# Patient Record
Sex: Female | Born: 1985 | Race: White | Hispanic: No | Marital: Single | Smoking: Current every day smoker
Health system: Southern US, Community
[De-identification: ages and names within clinical notes are randomized; demographics above are authoritative.]

## PROBLEM LIST (undated history)

## (undated) DIAGNOSIS — Z765 Malingerer [conscious simulation]: Secondary | ICD-10-CM

## (undated) DIAGNOSIS — G40109 Localization-related (focal) (partial) symptomatic epilepsy and epileptic syndromes with simple partial seizures, not intractable, without status epilepticus: Secondary | ICD-10-CM

## (undated) DIAGNOSIS — F1910 Other psychoactive substance abuse, uncomplicated: Secondary | ICD-10-CM

## (undated) DIAGNOSIS — N83209 Unspecified ovarian cyst, unspecified side: Secondary | ICD-10-CM

## (undated) DIAGNOSIS — B1920 Unspecified viral hepatitis C without hepatic coma: Secondary | ICD-10-CM

## (undated) DIAGNOSIS — F419 Anxiety disorder, unspecified: Secondary | ICD-10-CM

## (undated) DIAGNOSIS — M26629 Arthralgia of temporomandibular joint, unspecified side: Secondary | ICD-10-CM

## (undated) DIAGNOSIS — Z21 Asymptomatic human immunodeficiency virus [HIV] infection status: Secondary | ICD-10-CM

## (undated) DIAGNOSIS — R569 Unspecified convulsions: Secondary | ICD-10-CM

## (undated) DIAGNOSIS — B20 Human immunodeficiency virus [HIV] disease: Secondary | ICD-10-CM

## (undated) DIAGNOSIS — D696 Thrombocytopenia, unspecified: Secondary | ICD-10-CM

## (undated) DIAGNOSIS — N200 Calculus of kidney: Secondary | ICD-10-CM

## (undated) HISTORY — PX: ABDOMINAL HYSTERECTOMY: SHX81

## (undated) HISTORY — PX: LITHOTRIPSY: SUR834

## (undated) HISTORY — PX: ANKLE SURGERY: SHX546

## (undated) HISTORY — PX: TUBAL LIGATION: SHX77

## (undated) HISTORY — PX: APPENDECTOMY: SHX54

## (undated) HISTORY — PX: FRACTURE SURGERY: SHX138

---

## 2010-04-17 ENCOUNTER — Emergency Department (HOSPITAL_BASED_OUTPATIENT_CLINIC_OR_DEPARTMENT_OTHER): Admission: EM | Admit: 2010-04-17 | Discharge: 2010-04-17 | Payer: Self-pay | Admitting: Emergency Medicine

## 2010-04-25 ENCOUNTER — Emergency Department (HOSPITAL_BASED_OUTPATIENT_CLINIC_OR_DEPARTMENT_OTHER): Admission: EM | Admit: 2010-04-25 | Discharge: 2010-04-26 | Payer: Self-pay | Admitting: Emergency Medicine

## 2010-05-26 ENCOUNTER — Emergency Department (HOSPITAL_BASED_OUTPATIENT_CLINIC_OR_DEPARTMENT_OTHER): Admission: EM | Admit: 2010-05-26 | Discharge: 2010-05-26 | Payer: Self-pay | Admitting: Emergency Medicine

## 2010-06-26 ENCOUNTER — Emergency Department (HOSPITAL_BASED_OUTPATIENT_CLINIC_OR_DEPARTMENT_OTHER): Admission: EM | Admit: 2010-06-26 | Discharge: 2010-06-26 | Payer: Self-pay | Admitting: Emergency Medicine

## 2010-07-23 ENCOUNTER — Ambulatory Visit: Payer: Self-pay | Admitting: Diagnostic Radiology

## 2010-07-23 ENCOUNTER — Emergency Department (HOSPITAL_BASED_OUTPATIENT_CLINIC_OR_DEPARTMENT_OTHER): Admission: EM | Admit: 2010-07-23 | Discharge: 2010-07-23 | Payer: Self-pay | Admitting: Emergency Medicine

## 2010-09-06 ENCOUNTER — Emergency Department (HOSPITAL_BASED_OUTPATIENT_CLINIC_OR_DEPARTMENT_OTHER)
Admission: EM | Admit: 2010-09-06 | Discharge: 2010-09-06 | Payer: Self-pay | Source: Home / Self Care | Admitting: Emergency Medicine

## 2010-09-10 ENCOUNTER — Emergency Department (HOSPITAL_BASED_OUTPATIENT_CLINIC_OR_DEPARTMENT_OTHER)
Admission: EM | Admit: 2010-09-10 | Discharge: 2010-09-10 | Payer: Self-pay | Source: Home / Self Care | Admitting: Emergency Medicine

## 2010-09-14 ENCOUNTER — Emergency Department (HOSPITAL_BASED_OUTPATIENT_CLINIC_OR_DEPARTMENT_OTHER)
Admission: EM | Admit: 2010-09-14 | Discharge: 2010-09-14 | Payer: Self-pay | Source: Home / Self Care | Admitting: Emergency Medicine

## 2010-10-10 ENCOUNTER — Emergency Department (HOSPITAL_BASED_OUTPATIENT_CLINIC_OR_DEPARTMENT_OTHER)
Admission: EM | Admit: 2010-10-10 | Discharge: 2010-10-10 | Payer: Self-pay | Source: Home / Self Care | Admitting: Emergency Medicine

## 2011-03-07 ENCOUNTER — Emergency Department (HOSPITAL_BASED_OUTPATIENT_CLINIC_OR_DEPARTMENT_OTHER)
Admission: EM | Admit: 2011-03-07 | Discharge: 2011-03-07 | Disposition: A | Discharge status: Home / Self Care | Payer: Self-pay | Attending: Emergency Medicine | Admitting: Emergency Medicine

## 2011-03-07 DIAGNOSIS — Z21 Asymptomatic human immunodeficiency virus [HIV] infection status: Secondary | ICD-10-CM | POA: Insufficient documentation

## 2011-03-07 DIAGNOSIS — F17200 Nicotine dependence, unspecified, uncomplicated: Secondary | ICD-10-CM | POA: Insufficient documentation

## 2011-03-07 DIAGNOSIS — K089 Disorder of teeth and supporting structures, unspecified: Secondary | ICD-10-CM | POA: Insufficient documentation

## 2011-03-12 ENCOUNTER — Emergency Department (INDEPENDENT_AMBULATORY_CARE_PROVIDER_SITE_OTHER): Payer: Self-pay

## 2011-03-12 ENCOUNTER — Emergency Department (HOSPITAL_BASED_OUTPATIENT_CLINIC_OR_DEPARTMENT_OTHER)
Admission: EM | Admit: 2011-03-12 | Discharge: 2011-03-12 | Disposition: A | Discharge status: Home / Self Care | Payer: Self-pay | Attending: Emergency Medicine | Admitting: Emergency Medicine

## 2011-03-12 DIAGNOSIS — Z21 Asymptomatic human immunodeficiency virus [HIV] infection status: Secondary | ICD-10-CM | POA: Insufficient documentation

## 2011-03-12 DIAGNOSIS — W19XXXA Unspecified fall, initial encounter: Secondary | ICD-10-CM

## 2011-03-12 DIAGNOSIS — M25549 Pain in joints of unspecified hand: Secondary | ICD-10-CM

## 2011-03-12 DIAGNOSIS — S60229A Contusion of unspecified hand, initial encounter: Secondary | ICD-10-CM | POA: Insufficient documentation

## 2011-07-01 ENCOUNTER — Emergency Department (HOSPITAL_BASED_OUTPATIENT_CLINIC_OR_DEPARTMENT_OTHER)
Admission: EM | Admit: 2011-07-01 | Discharge: 2011-07-01 | Disposition: A | Discharge status: Home / Self Care | Payer: Self-pay | Attending: Emergency Medicine | Admitting: Emergency Medicine

## 2011-07-01 ENCOUNTER — Encounter: Payer: Self-pay | Admitting: *Deleted

## 2011-07-01 DIAGNOSIS — Z21 Asymptomatic human immunodeficiency virus [HIV] infection status: Secondary | ICD-10-CM | POA: Insufficient documentation

## 2011-07-01 DIAGNOSIS — K0889 Other specified disorders of teeth and supporting structures: Secondary | ICD-10-CM

## 2011-07-01 DIAGNOSIS — K089 Disorder of teeth and supporting structures, unspecified: Secondary | ICD-10-CM | POA: Insufficient documentation

## 2011-07-01 HISTORY — DX: Arthralgia of temporomandibular joint, unspecified side: M26.629

## 2011-07-01 HISTORY — DX: Human immunodeficiency virus (HIV) disease: B20

## 2011-07-01 HISTORY — DX: Asymptomatic human immunodeficiency virus (hiv) infection status: Z21

## 2011-07-01 MED ORDER — HYDROCODONE-ACETAMINOPHEN 5-325 MG PO TABS: 2.0000 | ORAL_TABLET | ORAL | Status: AC | PRN

## 2011-07-01 NOTE — ED Notes (Addendum)
Pt c/o pain in her upper left molar x1 week. Pt cannot see a dentist until next Friday. Pt sts she was supposed to get same filled when she was in jail but they released her prior to having it done. Pt sts she finished a 2 week course of amoxicllin yesterday.

## 2011-07-01 NOTE — ED Provider Notes (Addendum)
History     CSN: [id number] Arrival date & time: 07/01/2011  3:11 PM  Chief Complaint  Patient presents with  . Dental Pain    (Consider location/radiation/quality/duration/timing/severity/associated sxs/prior treatment) Patient is a 25 y.o. female presenting with tooth pain. The history is provided by the patient. No language interpreter was used.  Dental PainThe primary symptoms include mouth pain. The symptoms began more than 1 month ago. The symptoms are worsening. The symptoms are new. The symptoms occur constantly.  Additional symptoms include: gum swelling.    Past Medical History  Diagnosis Date  . HIV (human immunodeficiency virus infection)   . TMJ syndrome     Past Surgical History  Procedure Date  . Appendectomy   . Cesarean section   . Ankle surgery     No family history on file.  History  Substance Use Topics  . Smoking status: Passive Smoker  . Smokeless tobacco: Not on file  . Alcohol Use: No    OB History    Grav Para Term Preterm Abortions TAB SAB Ect Mult Living                  Review of Systems  HENT: Positive for dental problem.   All other systems reviewed and are negative.    Allergies  Penicillins; Naproxen; and Tramadol  Home Medications   Current Outpatient Rx  Name Route Sig Dispense Refill  . EMTRICITABINE-TENOFOVIR 200-300 MG PO TABS Oral Take 1 tablet by mouth daily.      Marland Kitchen FOSAMPRENAVIR CALCIUM 700 MG PO TABS Oral Take 1,400 mg by mouth daily.      . IBUPROFEN 200 MG PO TABS Oral Take 800 mg by mouth every 6 (six) hours as needed. For pain      . RITONAVIR 100 MG PO CAPS Oral Take 100 mg by mouth daily.        Pulse 94  Temp(Src) 98.1 F (36.7 C) (Oral)  Resp 17  SpO2 100%  LMP 06/10/2011  Physical Exam  Nursing note and vitals reviewed. Constitutional: She appears well-developed and well-nourished.  HENT:  Head: Normocephalic and atraumatic.       Swollen,  Gum, no obv abscess  Eyes: Conjunctivae and EOM  are normal. Pupils are equal, round, and reactive to light.  Neck: Normal range of motion. Neck supple.  Cardiovascular: Normal rate.   Pulmonary/Chest: Effort normal.  Abdominal: Soft.  Musculoskeletal: Normal range of motion.  Neurological: She is alert.  Skin: Skin is warm.    ED Course  Procedures (including critical care time)  Labs Reviewed - No data to display No results found.   No diagnosis found.    MDM          Langston Masker, PA 07/01/11 1727  Langston Masker, [place] 07/07/11 [phone]

## 2011-07-03 NOTE — ED Provider Notes (Signed)
Medical screening examination/treatment/procedure(s) were performed by non-physician practitioner and as supervising physician I was immediately available for consultation/collaboration.  Danzig Macgregor, MD 07/03/11 1929 

## 2011-07-06 ENCOUNTER — Emergency Department (INDEPENDENT_AMBULATORY_CARE_PROVIDER_SITE_OTHER): Payer: Self-pay

## 2011-07-06 ENCOUNTER — Emergency Department (HOSPITAL_BASED_OUTPATIENT_CLINIC_OR_DEPARTMENT_OTHER)
Admission: EM | Admit: 2011-07-06 | Discharge: 2011-07-06 | Disposition: A | Discharge status: Home / Self Care | Payer: Self-pay | Attending: Emergency Medicine | Admitting: Emergency Medicine

## 2011-07-06 ENCOUNTER — Encounter (HOSPITAL_BASED_OUTPATIENT_CLINIC_OR_DEPARTMENT_OTHER): Payer: Self-pay | Admitting: *Deleted

## 2011-07-06 DIAGNOSIS — T148XXA Other injury of unspecified body region, initial encounter: Secondary | ICD-10-CM

## 2011-07-06 DIAGNOSIS — M549 Dorsalgia, unspecified: Secondary | ICD-10-CM | POA: Insufficient documentation

## 2011-07-06 DIAGNOSIS — M25519 Pain in unspecified shoulder: Secondary | ICD-10-CM

## 2011-07-06 DIAGNOSIS — M542 Cervicalgia: Secondary | ICD-10-CM

## 2011-07-06 DIAGNOSIS — X503XXA Overexertion from repetitive movements, initial encounter: Secondary | ICD-10-CM | POA: Insufficient documentation

## 2011-07-06 LAB — URINALYSIS, ROUTINE W REFLEX MICROSCOPIC
Bilirubin Urine: NEGATIVE
Glucose, UA: NEGATIVE mg/dL
Hgb urine dipstick: NEGATIVE
Ketones, ur: NEGATIVE mg/dL
Leukocytes, UA: NEGATIVE
Nitrite: NEGATIVE
Protein, ur: NEGATIVE mg/dL
Specific Gravity, Urine: 1.01 (ref 1.005–1.030)
Urobilinogen, UA: 0.2 mg/dL (ref 0.0–1.0)
pH: 6.5 (ref 5.0–8.0)

## 2011-07-06 LAB — PREGNANCY, URINE: Preg Test, Ur: NEGATIVE

## 2011-07-06 MED ORDER — CYCLOBENZAPRINE HCL 5 MG PO TABS: 5.0000 mg | ORAL_TABLET | Freq: Two times a day (BID) | ORAL | Status: AC | PRN

## 2011-07-06 NOTE — ED Notes (Signed)
Pt c/o right shoulder pain and back pain x 1 day after moving furniture, seen here 5 days ago for dental pain and is out of vicodin

## 2011-07-06 NOTE — ED Notes (Signed)
Pt ambulated to radiology with a steady gait.

## 2011-07-06 NOTE — ED Provider Notes (Signed)
History     CSN: [id number] Arrival date & time: 07/06/2011  7:31 PM   First MD Initiated Contact with Patient 07/06/11 1931      Chief Complaint  Patient presents with  . Back Pain  . Shoulder Pain    (Consider location/radiation/quality/duration/timing/severity/associated sxs/prior treatment) HPI Comments: Pt states that she was doing some heavy lifting and then the her neck and back started to hurt and she has pain going down to her right shoulder  Patient is a 25 y.o. female presenting with neck injury. The history is provided by the patient. No language interpreter was used.  Neck Injury This is a new problem. The current episode started today. The problem occurs constantly. The problem has been unchanged. Pertinent negatives include no fever, numbness or weakness. Exacerbated by: movement. She has tried acetaminophen for the symptoms. The treatment provided no relief.    Past Medical History  Diagnosis Date  . HIV (human immunodeficiency virus infection)   . TMJ syndrome     Past Surgical History  Procedure Date  . Appendectomy   . Cesarean section   . Ankle surgery     History reviewed. No pertinent family history.  History  Substance Use Topics  . Smoking status: Passive Smoker  . Smokeless tobacco: Not on file  . Alcohol Use: No    OB History    Grav Para Term Preterm Abortions TAB SAB Ect Mult Living                  Review of Systems  Constitutional: Negative for fever.  Neurological: Negative for weakness and numbness.  All other systems reviewed and are negative.    Allergies  Penicillins; Naproxen; and Tramadol  Home Medications   Current Outpatient Rx  Name Route Sig Dispense Refill  . EMTRICITABINE-TENOFOVIR 200-300 MG PO TABS Oral Take 1 tablet by mouth daily.      Marland Kitchen FOSAMPRENAVIR CALCIUM 700 MG PO TABS Oral Take 1,400 mg by mouth daily.      Marland Kitchen HYDROCODONE-ACETAMINOPHEN 5-325 MG PO TABS Oral Take 2 tablets by mouth every 4 (four)  hours as needed for pain. 20 tablet 0  . IBUPROFEN 200 MG PO TABS Oral Take 800 mg by mouth every 6 (six) hours as needed. For pain      . RITONAVIR 100 MG PO CAPS Oral Take 100 mg by mouth daily.        BP 118/77  Pulse 109  Temp(Src) 98.3 F (36.8 C) (Oral)  Resp 16  Ht 5\' 5"  (1.651 m)  Wt 150 lb (68.04 kg)  BMI 24.96 kg/m2  SpO2 100%  LMP 06/10/2011  Physical Exam  Nursing note and vitals reviewed. Constitutional: She is oriented to person, place, and time. She appears well-developed and well-nourished.  HENT:  Head: Normocephalic and atraumatic.  Neck: Neck supple.  Cardiovascular: Normal rate and regular rhythm.   Pulmonary/Chest: Effort normal and breath sounds normal.  Abdominal: Soft. Bowel sounds are normal.  Musculoskeletal: Normal range of motion.       Cervical back: She exhibits tenderness and bony tenderness.       Lumbar back: She exhibits tenderness.  Neurological: She is alert and oriented to person, place, and time.  Skin: Skin is warm and dry.    ED Course  Procedures (including critical care time)   Labs Reviewed  URINALYSIS, ROUTINE W REFLEX MICROSCOPIC  PREGNANCY, URINE   No results found.   1. Muscle strain   2. Back pain  MDM  Pt not having any neuro deficits likely related to strain:pt is okay to follow up        Teressa Lower, NP 07/06/11 2041

## 2011-07-08 NOTE — ED Provider Notes (Signed)
Evaluation and management procedures were performed by the PA/NP under my supervision/collaboration.    Khristian Phillippi D Romesha Scherer, MD 07/08/11 1925 

## 2011-07-12 NOTE — ED Provider Notes (Signed)
Medical screening examination/treatment/procedure(s) were performed by non-physician practitioner and as supervising physician I was immediately available for consultation/collaboration.  Dorin Stooksbury, MD 07/12/11 1401 

## 2011-07-21 ENCOUNTER — Emergency Department (HOSPITAL_BASED_OUTPATIENT_CLINIC_OR_DEPARTMENT_OTHER)
Admission: EM | Admit: 2011-07-21 | Discharge: 2011-07-22 | Disposition: A | Discharge status: Home / Self Care | Payer: Self-pay | Attending: Emergency Medicine | Admitting: Emergency Medicine

## 2011-07-21 ENCOUNTER — Encounter (HOSPITAL_BASED_OUTPATIENT_CLINIC_OR_DEPARTMENT_OTHER): Payer: Self-pay | Admitting: *Deleted

## 2011-07-21 DIAGNOSIS — K089 Disorder of teeth and supporting structures, unspecified: Secondary | ICD-10-CM | POA: Insufficient documentation

## 2011-07-21 DIAGNOSIS — K0889 Other specified disorders of teeth and supporting structures: Secondary | ICD-10-CM

## 2011-07-21 DIAGNOSIS — Z21 Asymptomatic human immunodeficiency virus [HIV] infection status: Secondary | ICD-10-CM | POA: Insufficient documentation

## 2011-07-21 DIAGNOSIS — K029 Dental caries, unspecified: Secondary | ICD-10-CM | POA: Insufficient documentation

## 2011-07-21 MED ORDER — HYDROCODONE-ACETAMINOPHEN 5-325 MG PO TABS: ORAL_TABLET | ORAL | Status: DC

## 2011-07-21 MED ORDER — CLINDAMYCIN HCL 150 MG PO CAPS: ORAL_CAPSULE | ORAL | Status: DC

## 2011-07-21 NOTE — ED Notes (Signed)
Pt c/o toothache x 1 day. 

## 2011-07-21 NOTE — ED Provider Notes (Signed)
History     CSN: [id number] Arrival date & time: 07/21/2011  9:44 PM   First MD Initiated Contact with Patient 07/21/11 2343      Chief Complaint  Patient presents with  . Dental Pain    (Consider location/radiation/quality/duration/timing/severity/associated sxs/prior treatment) HPI This is a 25 year old white female with about a four-month history of a toothache located in her left upper first molar. The pain worsened last month she was seen here on October 17. She states she cannot afford to see a dentist and returns with acutely worsening pain. It is exacerbated by eating, not relieved by Goody powders, and radiates to the left side of her face. There is no associated cervical lymphadenopathy but she does complain of a bad taste in her mouth. She is being treated for HIV infection.  Past Medical History  Diagnosis Date  . HIV (human immunodeficiency virus infection)   . TMJ syndrome     Past Surgical History  Procedure Date  . Appendectomy   . Cesarean section   . Ankle surgery     History reviewed. No pertinent family history.  History  Substance Use Topics  . Smoking status: Passive Smoker  . Smokeless tobacco: Not on file  . Alcohol Use: No    OB History    Grav Para Term Preterm Abortions TAB SAB Ect Mult Living                  Review of Systems  All other systems reviewed and are negative.    Allergies  Penicillins; Naproxen; and Tramadol  Home Medications   Current Outpatient Rx  Name Route Sig Dispense Refill  . EMTRICITABINE-TENOFOVIR 200-300 MG PO TABS Oral Take 1 tablet by mouth daily.      Marland Kitchen FOSAMPRENAVIR CALCIUM 700 MG PO TABS Oral Take 1,400 mg by mouth daily.      . IBUPROFEN 200 MG PO TABS Oral Take 800 mg by mouth every 6 (six) hours as needed. For pain      . RITONAVIR 100 MG PO CAPS Oral Take 100 mg by mouth daily.        BP 115/70  Pulse 73  Temp(Src) 98.4 F (36.9 C) (Oral)  Resp 16  Ht 5\' 5"  (1.651 m)  Wt 150 lb (68.04  kg)  BMI 24.96 kg/m2  SpO2 100%  LMP 06/20/2011  Physical Exam General: Well-developed, well-nourished female in no acute distress; appearance consistent with age of record HENT: normocephalic, atraumatic; carious left upper first molar with amalgam filling, very tender to percussion Eyes: Normal appearance Neck: supple; no lymphadenopathy Heart: regular rate and rhythm Lungs: Normal respiratory effort and excursion Abdomen: soft; nondistended Extremities: No deformity; full range of motion Neurologic: motor function intact in all extremities and symmetric; no facial droop Skin: Warm and dry     ED Course  Procedures (including critical care time)    MDM  Referred to [facility] at the [facility] this November 16 and 17th the        Hanley Seamen, MD 07/21/11 2357

## 2011-07-22 MED ORDER — AMOXICILLIN 500 MG PO CAPS: 1000.0000 mg | ORAL_CAPSULE | Freq: Two times a day (BID) | ORAL | Status: AC

## 2011-08-09 ENCOUNTER — Encounter (HOSPITAL_BASED_OUTPATIENT_CLINIC_OR_DEPARTMENT_OTHER): Payer: Self-pay | Admitting: *Deleted

## 2011-08-09 ENCOUNTER — Emergency Department (INDEPENDENT_AMBULATORY_CARE_PROVIDER_SITE_OTHER): Payer: Self-pay

## 2011-08-09 ENCOUNTER — Emergency Department (HOSPITAL_BASED_OUTPATIENT_CLINIC_OR_DEPARTMENT_OTHER)
Admission: EM | Admit: 2011-08-09 | Discharge: 2011-08-09 | Disposition: A | Discharge status: Home / Self Care | Payer: Self-pay | Attending: Emergency Medicine | Admitting: Emergency Medicine

## 2011-08-09 DIAGNOSIS — W010XXA Fall on same level from slipping, tripping and stumbling without subsequent striking against object, initial encounter: Secondary | ICD-10-CM | POA: Insufficient documentation

## 2011-08-09 DIAGNOSIS — Y92009 Unspecified place in unspecified non-institutional (private) residence as the place of occurrence of the external cause: Secondary | ICD-10-CM | POA: Insufficient documentation

## 2011-08-09 DIAGNOSIS — M25539 Pain in unspecified wrist: Secondary | ICD-10-CM

## 2011-08-09 DIAGNOSIS — S60211A Contusion of right wrist, initial encounter: Secondary | ICD-10-CM

## 2011-08-09 DIAGNOSIS — Z21 Asymptomatic human immunodeficiency virus [HIV] infection status: Secondary | ICD-10-CM | POA: Insufficient documentation

## 2011-08-09 DIAGNOSIS — S60219A Contusion of unspecified wrist, initial encounter: Secondary | ICD-10-CM | POA: Insufficient documentation

## 2011-08-09 DIAGNOSIS — W19XXXA Unspecified fall, initial encounter: Secondary | ICD-10-CM

## 2011-08-09 DIAGNOSIS — Z79899 Other long term (current) drug therapy: Secondary | ICD-10-CM | POA: Insufficient documentation

## 2011-08-09 MED ORDER — ACETAMINOPHEN 160 MG PO TABS: 320.0000 mg | ORAL_TABLET | Freq: Four times a day (QID) | ORAL | Status: AC | PRN

## 2011-08-09 MED ORDER — OXYCODONE-ACETAMINOPHEN 5-325 MG PO TABS
1.0000 | ORAL_TABLET | Freq: Once | ORAL | Status: AC
  Administered 2011-08-09: 1 via ORAL
  Filled 2011-08-09: qty 1

## 2011-08-09 MED ORDER — ONDANSETRON HCL 8 MG PO TABS
8.0000 mg | ORAL_TABLET | Freq: Once | ORAL | Status: AC
  Administered 2011-08-09: 8 mg via ORAL
  Filled 2011-08-09: qty 1

## 2011-08-09 NOTE — ED Notes (Signed)
Dr. Campos at bedside   

## 2011-08-09 NOTE — ED Notes (Signed)
Pt c/o right arm pain x 1 day

## 2011-08-09 NOTE — ED Provider Notes (Signed)
History     CSN: [id number] Arrival date & time: 08/09/2011  5:01 PM   First MD Initiated Contact with Patient 08/09/11 1653      Chief Complaint  Patient presents with  . Arm Injury    (Consider location/radiation/quality/duration/timing/severity/associated sxs/prior treatment) Patient is a 25 y.o. female presenting with arm injury. The history is provided by the patient.  Arm Injury    patient reports slipping on her floor in her house this afternoon landing on the right side of her body and injured her right wrist.  She is unsure if she extended her right arm and fell on an outstretched hand or if she just landed on it.  He reports pain with range of motion of her right wrist.  Her pain is worsened by palpation and movement.  This improved by nothing.  She reports the pain is severe this time on the way here she became nauseated.  She denies abdominal pain chest pain shortness of breath.  Past Medical History  Diagnosis Date  . HIV (human immunodeficiency virus infection)   . TMJ syndrome     Past Surgical History  Procedure Date  . Appendectomy   . Cesarean section   . Ankle surgery     History reviewed. No pertinent family history.  History  Substance Use Topics  . Smoking status: Passive Smoker  . Smokeless tobacco: Not on file  . Alcohol Use: No    OB History    Grav Para Term Preterm Abortions TAB SAB Ect Mult Living                  Review of Systems  All other systems reviewed and are negative.    Allergies  Penicillins; Naproxen; and Tramadol  Home Medications   Current Outpatient Rx  Name Route Sig Dispense Refill  . EMTRICITABINE-TENOFOVIR 200-300 MG PO TABS Oral Take 1 tablet by mouth daily.      Marland Kitchen FOSAMPRENAVIR CALCIUM 700 MG PO TABS Oral Take 1,400 mg by mouth daily.      . IBUPROFEN 200 MG PO TABS Oral Take 800 mg by mouth every 6 (six) hours as needed. For pain      . RITONAVIR 100 MG PO CAPS Oral Take 100 mg by mouth daily.      Marland Kitchen  HYDROCODONE-ACETAMINOPHEN 5-325 MG PO TABS  1-2 tablets every 6 hours as needed for pain. 20 tablet 0    BP 99/66  Pulse 92  Temp(Src) 98.1 F (36.7 C) (Oral)  Resp 16  Ht 5\' 5"  (1.651 m)  Wt 150 lb (68.04 kg)  BMI 24.96 kg/m2  SpO2 99%  LMP 07/21/2011  Physical Exam  Constitutional: She is oriented to person, place, and time. She appears well-developed and well-nourished.  HENT:  Head: Normocephalic.  Eyes: EOM are normal.  Neck: Normal range of motion.  Pulmonary/Chest: Effort normal.  Abdominal: She exhibits no distension. There is no tenderness.  Musculoskeletal: Normal range of motion.       Mild tenderness of the distal radius and ulnar.  There is no snuff box tenderness.  Her right hand is neurovascularly intact.  His normal right radial pulse.  There is no significant swelling or bruising noted.  Neurological: She is alert and oriented to person, place, and time.  Psychiatric: She has a normal mood and affect.    ED Course  Procedures (including critical care time)  Labs Reviewed - No data to display Dg Wrist Complete Right  08/09/2011  *RADIOLOGY  REPORT*  Clinical Data: Larey Seat and injured right wrist.  RIGHT WRIST - COMPLETE 3+ VIEW 08/09/2011:  Comparison: No prior wrist imaging.  Right hand x-rays 03/12/2011.  Findings: No evidence of acute or subacute fracture or dislocation. Well-preserved joint spaces.  Well-preserved bone mineral density. No intrinsic osseous abnormalities.  IMPRESSION: Normal examination.  Original Report Authenticated By: Arnell Sieving, M.D.   I personally reviewed the x-ray  1. Contusion of right wrist       MDM  Suspect contusion/sprain of right wrist.  X-ray pending.  Pain and nausea treated in the ER.  Abdominal exam is benign        Lyanne Co, MD 08/09/11 1740

## 2011-09-09 ENCOUNTER — Encounter (HOSPITAL_BASED_OUTPATIENT_CLINIC_OR_DEPARTMENT_OTHER): Payer: Self-pay

## 2011-09-09 ENCOUNTER — Emergency Department (HOSPITAL_BASED_OUTPATIENT_CLINIC_OR_DEPARTMENT_OTHER)
Admission: EM | Admit: 2011-09-09 | Discharge: 2011-09-09 | Disposition: A | Discharge status: Home / Self Care | Payer: Self-pay | Attending: Emergency Medicine | Admitting: Emergency Medicine

## 2011-09-09 DIAGNOSIS — Z21 Asymptomatic human immunodeficiency virus [HIV] infection status: Secondary | ICD-10-CM | POA: Insufficient documentation

## 2011-09-09 DIAGNOSIS — K0889 Other specified disorders of teeth and supporting structures: Secondary | ICD-10-CM

## 2011-09-09 DIAGNOSIS — K089 Disorder of teeth and supporting structures, unspecified: Secondary | ICD-10-CM | POA: Insufficient documentation

## 2011-09-09 MED ORDER — HYDROCODONE-ACETAMINOPHEN 5-500 MG PO TABS: 1.0000 | ORAL_TABLET | ORAL | Status: AC | PRN

## 2011-09-09 MED ORDER — IBUPROFEN 600 MG PO TABS: 600.0000 mg | ORAL_TABLET | Freq: Four times a day (QID) | ORAL | Status: AC | PRN

## 2011-09-09 MED ORDER — IBUPROFEN 400 MG PO TABS: 600.0000 mg | ORAL_TABLET | Freq: Once | ORAL | Status: DC

## 2011-09-09 MED ORDER — HYDROCODONE-ACETAMINOPHEN 5-325 MG PO TABS: 1.0000 | ORAL_TABLET | Freq: Once | ORAL | Status: DC

## 2011-09-09 MED ORDER — AMOXICILLIN-POT CLAVULANATE 875-125 MG PO TABS: 1.0000 | ORAL_TABLET | Freq: Two times a day (BID) | ORAL | Status: AC

## 2011-09-09 NOTE — ED Provider Notes (Signed)
History     CSN: [id number]  Arrival date & time 09/09/11  1352   First MD Initiated Contact with Patient 09/09/11 1428      Chief Complaint  Patient presents with  . Dental Pain    (Consider location/radiation/quality/duration/timing/severity/associated sxs/prior treatment) HPI  H/o HIV pw dental pain. Patient with a history of poor dentition. She complains of approximately one week of multiple teeth aching. Patient complaining of right per molar, left lower molar, left upper molar dental pain x one week. 8/10. The patient states the pain has been constant and worsening throughout the week. She denies fever, chills. She's been taking ibuprofen at home without relief. She states she's been compliant with her HAART medications. She denies headache, dizziness, change in vision.    Past Medical History  Diagnosis Date  . HIV (human immunodeficiency virus infection)   . TMJ syndrome     Past Surgical History  Procedure Date  . Appendectomy   . Cesarean section   . Ankle surgery     No family history on file.  History  Substance Use Topics  . Smoking status: Passive Smoker  . Smokeless tobacco: Not on file  . Alcohol Use: No    OB History    Grav Para Term Preterm Abortions TAB SAB Ect Mult Living                  Review of Systems  All other systems reviewed and are negative.   except as noted HPI   Allergies  Penicillins; Naproxen; and Tramadol  Home Medications   Current Outpatient Rx  Name Route Sig Dispense Refill  . AMOXICILLIN-POT CLAVULANATE 875-125 MG PO TABS Oral Take 1 tablet by mouth 2 (two) times daily. 20 tablet 0  . EMTRICITABINE-TENOFOVIR 200-300 MG PO TABS Oral Take 1 tablet by mouth daily.      Marland Kitchen FOSAMPRENAVIR CALCIUM 700 MG PO TABS Oral Take 1,400 mg by mouth daily.      Marland Kitchen HYDROCODONE-ACETAMINOPHEN 5-325 MG PO TABS  1-2 tablets every 6 hours as needed for pain. 20 tablet 0  . HYDROCODONE-ACETAMINOPHEN 5-500 MG PO TABS Oral Take 1 tablet  by mouth every 4 (four) hours as needed for pain. 15 tablet 0  . IBUPROFEN 200 MG PO TABS Oral Take 800 mg by mouth every 6 (six) hours as needed. For pain      . IBUPROFEN 600 MG PO TABS Oral Take 1 tablet (600 mg total) by mouth every 6 (six) hours as needed for pain. 30 tablet 0  . RITONAVIR 100 MG PO CAPS Oral Take 100 mg by mouth daily.        BP 106/73  Pulse 85  Temp(Src) 97.9 F (36.6 C) (Oral)  Resp 16  Ht 5\' 5"  (1.651 m)  Wt 150 lb (68.04 kg)  BMI 24.96 kg/m2  SpO2 100%  LMP 09/02/2011  Physical Exam  Nursing note and vitals reviewed. Constitutional: She is oriented to person, place, and time. She appears well-developed.  HENT:  Head: Atraumatic.  Mouth/Throat: Oropharynx is clear and moist.         Poor dentition, multiple tooth filling, multiple teeth pulled in past  Eyes: Conjunctivae and EOM are normal. Pupils are equal, round, and reactive to light.  Neck: Normal range of motion. Neck supple.  Cardiovascular: Normal rate, regular rhythm, normal heart sounds and intact distal pulses.   Pulmonary/Chest: Effort normal and breath sounds normal. No respiratory distress. She has no wheezes. She has no  rales.  Abdominal: Soft. She exhibits no distension. There is no tenderness. There is no rebound and no guarding.  Musculoskeletal: Normal range of motion.  Neurological: She is alert and oriented to person, place, and time.  Skin: Skin is warm and dry. No rash noted.  Psychiatric: She has a normal mood and affect.    ED Course  Procedures (including critical care time)  Labs Reviewed - No data to display No results found.   1. Pain, dental     MDM  Low suspicion of dental abscess given multiple teeth in different locations. We'll give her ibuprofen and Vicodin here. The same for pain control at home. Advised to make a dental followup. I did give her a prescription for Augmentin for worsening pain, fever given her history of HIV. She states that she has an  allergy to penicillin which includes a rash but she has tolerated amoxicillin in the past.        Forbes Cellar, MD 09/09/11 1521

## 2011-09-09 NOTE — ED Notes (Signed)
Pt reports dental pain x 1 week.  °

## 2011-09-16 DIAGNOSIS — K089 Disorder of teeth and supporting structures, unspecified: Secondary | ICD-10-CM | POA: Insufficient documentation

## 2011-09-16 DIAGNOSIS — Z21 Asymptomatic human immunodeficiency virus [HIV] infection status: Secondary | ICD-10-CM | POA: Insufficient documentation

## 2011-09-16 DIAGNOSIS — Z79899 Other long term (current) drug therapy: Secondary | ICD-10-CM | POA: Insufficient documentation

## 2011-09-16 NOTE — ED Notes (Signed)
Pt c/o tooth pain. Pt states she is to follow up with dentist 1/18.

## 2011-09-17 ENCOUNTER — Encounter (HOSPITAL_BASED_OUTPATIENT_CLINIC_OR_DEPARTMENT_OTHER): Payer: Self-pay | Admitting: Emergency Medicine

## 2011-09-17 ENCOUNTER — Emergency Department (HOSPITAL_BASED_OUTPATIENT_CLINIC_OR_DEPARTMENT_OTHER)
Admission: EM | Admit: 2011-09-17 | Discharge: 2011-09-17 | Disposition: A | Discharge status: Home / Self Care | Payer: Self-pay | Attending: Emergency Medicine | Admitting: Emergency Medicine

## 2011-09-17 DIAGNOSIS — K0889 Other specified disorders of teeth and supporting structures: Secondary | ICD-10-CM

## 2011-09-17 MED ORDER — HYDROCODONE-ACETAMINOPHEN 5-325 MG PO TABS
1.0000 | ORAL_TABLET | Freq: Once | ORAL | Status: AC
  Administered 2011-09-17: 1 via ORAL
  Filled 2011-09-17: qty 1

## 2011-09-17 MED ORDER — HYDROCODONE-ACETAMINOPHEN 5-500 MG PO TABS: 1.0000 | ORAL_TABLET | ORAL | Status: AC | PRN

## 2011-09-17 NOTE — ED Provider Notes (Signed)
History     CSN: [id number]  Arrival date & time 09/16/11  2319   First MD Initiated Contact with Patient 09/17/11 0127      Chief Complaint  Patient presents with  . Dental Pain    (Consider location/radiation/quality/duration/timing/severity/associated sxs/prior treatment) HPI  25yoF h/o HIV pw dental pain. Pt seen by me on 12/21 for same. She states that she ran out of Vicodin 3 days ago and then her pain returned. She's been unable to see dentistry. She continues to complain of pain in her left upper molar, left lower molar, right upper molar. The pain is worse on the left side she describes as 6/10 at this time with radiation to Lt face. She denies fever, chills. She's been taking previously prescribed prescription for amoxicillin and not the Augmentin that was prescribed last time she was seen.    Past Medical History  Diagnosis Date  . HIV (human immunodeficiency virus infection)   . TMJ syndrome     Past Surgical History  Procedure Date  . Appendectomy   . Cesarean section   . Ankle surgery     No family history on file.  History  Substance Use Topics  . Smoking status: Passive Smoker  . Smokeless tobacco: Not on file  . Alcohol Use: No    OB History    Grav Para Term Preterm Abortions TAB SAB Ect Mult Living                  Review of Systems  All other systems reviewed and are negative.   except as noted HPI   Allergies  Penicillins; Naproxen; and Tramadol  Home Medications   Current Outpatient Rx  Name Route Sig Dispense Refill  . AMOXICILLIN-POT CLAVULANATE 875-125 MG PO TABS Oral Take 1 tablet by mouth 2 (two) times daily. 20 tablet 0  . EMTRICITABINE-TENOFOVIR 200-300 MG PO TABS Oral Take 1 tablet by mouth daily.      Marland Kitchen FOSAMPRENAVIR CALCIUM 700 MG PO TABS Oral Take 1,400 mg by mouth daily.      Marland Kitchen HYDROCODONE-ACETAMINOPHEN 5-325 MG PO TABS  1-2 tablets every 6 hours as needed for pain. 20 tablet 0  . HYDROCODONE-ACETAMINOPHEN 5-500 MG  PO TABS Oral Take 1 tablet by mouth every 4 (four) hours as needed for pain. 15 tablet 0  . HYDROCODONE-ACETAMINOPHEN 5-500 MG PO TABS Oral Take 1 tablet by mouth every 4 (four) hours as needed for pain. 20 tablet 0  . IBUPROFEN 200 MG PO TABS Oral Take 800 mg by mouth every 6 (six) hours as needed. For pain      . IBUPROFEN 600 MG PO TABS Oral Take 1 tablet (600 mg total) by mouth every 6 (six) hours as needed for pain. 30 tablet 0  . RITONAVIR 100 MG PO CAPS Oral Take 100 mg by mouth daily.        BP 113/75  Pulse 81  Temp(Src) 97.6 F (36.4 C) (Oral)  Resp 18  SpO2 100%  LMP 09/02/2011  Physical Exam  Nursing note and vitals reviewed. Constitutional: She is oriented to person, place, and time. She appears well-developed.  HENT:  Head: Atraumatic.  Mouth/Throat: Oropharynx is clear and moist.         B/l TM clear  Eyes: Conjunctivae and EOM are normal. Pupils are equal, round, and reactive to light.  Neck: Normal range of motion. Neck supple.  Cardiovascular: Normal rate, regular rhythm, normal heart sounds and intact distal pulses.  Pulmonary/Chest: Effort normal and breath sounds normal. No respiratory distress. She has no wheezes. She has no rales.  Abdominal: Soft. She exhibits no distension. There is no tenderness. There is no rebound and no guarding.  Musculoskeletal: Normal range of motion.  Neurological: She is alert and oriented to person, place, and time.  Skin: Skin is warm and dry. No rash noted.  Psychiatric: She has a normal mood and affect.    ED Course  Procedures (including critical care time)  Labs Reviewed - No data to display No results found.   1. Pain, dental     MDM  Pain control, augmentin (previously prescribed). Needs dental f/u. No drainable abscess today.        Forbes Cellar, MD 09/17/11 0201

## 2011-10-08 ENCOUNTER — Encounter (HOSPITAL_BASED_OUTPATIENT_CLINIC_OR_DEPARTMENT_OTHER): Payer: Self-pay | Admitting: *Deleted

## 2011-10-08 ENCOUNTER — Emergency Department (HOSPITAL_BASED_OUTPATIENT_CLINIC_OR_DEPARTMENT_OTHER)
Admission: EM | Admit: 2011-10-08 | Discharge: 2011-10-08 | Disposition: A | Discharge status: Home / Self Care | Payer: Medicaid Other | Attending: Emergency Medicine | Admitting: Emergency Medicine

## 2011-10-08 DIAGNOSIS — K029 Dental caries, unspecified: Secondary | ICD-10-CM | POA: Insufficient documentation

## 2011-10-08 DIAGNOSIS — Z21 Asymptomatic human immunodeficiency virus [HIV] infection status: Secondary | ICD-10-CM | POA: Insufficient documentation

## 2011-10-08 DIAGNOSIS — K089 Disorder of teeth and supporting structures, unspecified: Secondary | ICD-10-CM | POA: Insufficient documentation

## 2011-10-08 DIAGNOSIS — K0889 Other specified disorders of teeth and supporting structures: Secondary | ICD-10-CM

## 2011-10-08 MED ORDER — HYDROCODONE-ACETAMINOPHEN 5-325 MG PO TABS: 1.0000 | ORAL_TABLET | Freq: Four times a day (QID) | ORAL | Status: AC | PRN

## 2011-10-08 NOTE — ED Provider Notes (Signed)
History    Scribed for Hurman Horn, MD, the patient was seen in room [facility]. This chart was scribed by Katha Cabal.   CSN: [id number]  Arrival date & time 10/08/11  1816   First MD Initiated Contact with Patient 10/08/11 2030      Chief Complaint  Patient presents with  . Dental Pain    (Consider location/radiation/quality/duration/timing/severity/associated sxs/prior treatment) HPI Dawn Velasquez is a 26 y.o. female who presents to the Emergency Department complaining of moderate  to severe persistent left maxillary molar region pain.  Patient reports intermittent pain since June.  Symptoms are not associated with fever, abdominal pain, chest pain, cough, headache or neck stiffness. Patient taking ibuprofen  for pain.  Patient last took Augmentin in December.  Patient did not take the Clindamycin.    Patient has not followed up with her dentist.    Dentist Dr. Shelly Rubenstein   Past Medical History  Diagnosis Date  . HIV (human immunodeficiency virus infection)   . TMJ syndrome     Past Surgical History  Procedure Date  . Appendectomy   . Cesarean section   . Ankle surgery     History reviewed. No pertinent family history.  History  Substance Use Topics  . Smoking status: Passive Smoker  . Smokeless tobacco: Not on file  . Alcohol Use: No    OB History    Grav Para Term Preterm Abortions TAB SAB Ect Mult Living                  Review of Systems  Constitutional: Negative for fever.       10 Systems reviewed and are negative for acute change except as noted in the HPI.  HENT: Positive for dental problem. Negative for congestion and neck stiffness.   Eyes: Negative for discharge and redness.  Respiratory: Negative for cough and shortness of breath.   Cardiovascular: Negative for chest pain.  Gastrointestinal: Negative for vomiting and abdominal pain.  Musculoskeletal: Negative for back pain.  Skin: Negative for rash.  Neurological: Negative for  syncope, numbness and headaches.  Psychiatric/Behavioral:       No behavior change.    Allergies  Penicillins; Naproxen; and Tramadol  Home Medications   Current Outpatient Rx  Name Route Sig Dispense Refill  . EMTRICITABINE-TENOFOVIR 200-300 MG PO TABS Oral Take 1 tablet by mouth daily.      Marland Kitchen FOSAMPRENAVIR CALCIUM 700 MG PO TABS Oral Take 1,400 mg by mouth daily.      . IBUPROFEN 200 MG PO TABS Oral Take 800 mg by mouth every 6 (six) hours as needed. For pain      . RITONAVIR 100 MG PO CAPS Oral Take 100 mg by mouth daily.      Marland Kitchen HYDROCODONE-ACETAMINOPHEN 5-325 MG PO TABS Oral Take 1 tablet by mouth every 6 (six) hours as needed for pain. 10 tablet 0    BP 115/79  Pulse 80  Temp(Src) 98 F (36.7 C) (Oral)  Resp 18  Ht 5\' 5"  (1.651 m)  Wt 160 lb (72.576 kg)  BMI 26.63 kg/m2  SpO2 99%  LMP 09/24/2011  Physical Exam  Nursing note and vitals reviewed. Constitutional:       Awake, alert, nontoxic appearance.  HENT:  Head: Atraumatic.       Multiple dental caries, tender in left maxillary molar region, no gingival swelling or pus drainage   Eyes: Right eye exhibits no discharge. Left eye exhibits no discharge.  Neck: Neck  supple.  Pulmonary/Chest: Effort normal. She exhibits no tenderness.  Abdominal: Soft. There is no tenderness. There is no rebound.  Musculoskeletal: She exhibits no tenderness.       Baseline ROM, no obvious new focal weakness.  Neurological:       Mental status and motor strength appears baseline for patient and situation.  Skin: No rash noted.  Psychiatric: She has a normal mood and affect.    ED Course  Procedures (including critical care time)   DIAGNOSTIC STUDIES: Oxygen Saturation is 99% on room air, normal by my interpretation.     COORDINATION OF CARE: 8:40 PM  Physical exam complete.   8:43 PM  Plan to discharge patient.  Patient agrees with plan.      LABS / RADIOLOGY:   Labs Reviewed - No data to display No results  found.   1. Pain, dental   2. Dental caries       MDM  I doubt any other EMC precluding discharge at this time including, but not necessarily limited to the following:airway compromise.    I personally performed the services described in this documentation, which was scribed in my presence. The recorded information has been reviewed and considered.   Hurman Horn, MD 10/09/11 [phone]

## 2011-10-08 NOTE — ED Notes (Signed)
Pt states she has had dental pain since June, but" just got her Medicaid yesterday and since Monday is a holiday, cannot see her dentist until Tuesday"

## 2011-10-27 ENCOUNTER — Encounter (HOSPITAL_BASED_OUTPATIENT_CLINIC_OR_DEPARTMENT_OTHER): Payer: Self-pay | Admitting: *Deleted

## 2011-10-27 ENCOUNTER — Emergency Department (HOSPITAL_BASED_OUTPATIENT_CLINIC_OR_DEPARTMENT_OTHER)
Admission: EM | Admit: 2011-10-27 | Discharge: 2011-10-27 | Disposition: A | Discharge status: Home / Self Care | Payer: Medicaid Other | Attending: Emergency Medicine | Admitting: Emergency Medicine

## 2011-10-27 DIAGNOSIS — K089 Disorder of teeth and supporting structures, unspecified: Secondary | ICD-10-CM | POA: Insufficient documentation

## 2011-10-27 DIAGNOSIS — Z21 Asymptomatic human immunodeficiency virus [HIV] infection status: Secondary | ICD-10-CM | POA: Insufficient documentation

## 2011-10-27 DIAGNOSIS — R51 Headache: Secondary | ICD-10-CM | POA: Insufficient documentation

## 2011-10-27 DIAGNOSIS — K0889 Other specified disorders of teeth and supporting structures: Secondary | ICD-10-CM

## 2011-10-27 MED ORDER — OXYCODONE-ACETAMINOPHEN 5-325 MG PO TABS: 1.0000 | ORAL_TABLET | ORAL | Status: DC | PRN

## 2011-10-27 MED ORDER — LIDOCAINE-EPINEPHRINE 2 %-1:100000 IJ SOLN
INTRAMUSCULAR | Status: AC
  Administered 2011-10-27: 1 mL
  Filled 2011-10-27: qty 1

## 2011-10-27 NOTE — ED Provider Notes (Signed)
History     CSN: [id number]  Arrival date & time 10/27/11  0003   None     Chief Complaint  Patient presents with  . Dental Pain    (Consider location/radiation/quality/duration/timing/severity/associated sxs/prior treatment) HPI Comments: 26 year old female with a history of HIV and right upper tooth extraction that occurred approximately 10 hours ago. She presents with pain and blood clot in the place where the tooth was extracted. It was a right upper premolar. Symptoms are constant, mild to moderate, not associated with fever, swelling, vomit  Patient is a 26 y.o. female presenting with tooth pain. The history is provided by the patient and medical records.  Dental PainThe primary symptoms include headaches. Primary symptoms do not include fever or sore throat.    Past Medical History  Diagnosis Date  . HIV (human immunodeficiency virus infection)   . TMJ syndrome     Past Surgical History  Procedure Date  . Appendectomy   . Cesarean section   . Ankle surgery     History reviewed. No pertinent family history.  History  Substance Use Topics  . Smoking status: Passive Smoker  . Smokeless tobacco: Not on file  . Alcohol Use: No    OB History    Grav Para Term Preterm Abortions TAB SAB Ect Mult Living                  Review of Systems  Constitutional: Negative for fever and chills.  HENT: Negative for sore throat and neck pain.   Gastrointestinal: Negative for nausea and vomiting.  Neurological: Positive for headaches.  Hematological: Negative for adenopathy.    Allergies  Penicillins; Naproxen; and Tramadol  Home Medications   Current Outpatient Rx  Name Route Sig Dispense Refill  . EMTRICITABINE-TENOFOVIR 200-300 MG PO TABS Oral Take 1 tablet by mouth daily.      Marland Kitchen FOSAMPRENAVIR CALCIUM 700 MG PO TABS Oral Take 1,400 mg by mouth daily.      . IBUPROFEN 200 MG PO TABS Oral Take 800 mg by mouth every 6 (six) hours as needed. For pain      .  OXYCODONE-ACETAMINOPHEN 5-325 MG PO TABS Oral Take 1 tablet by mouth every 4 (four) hours as needed for pain. May take 2 tablets PO q 6 hours for severe pain - Do not take with Tylenol as this tablet already contains tylenol 12 tablet 0  . RITONAVIR 100 MG PO CAPS Oral Take 100 mg by mouth daily.        BP 128/90  Pulse 72  Temp(Src) 97.8 F (36.6 C) (Oral)  Resp 17  Ht 5\' 5"  (1.651 m)  Wt 160 lb (72.576 kg)  BMI 26.63 kg/m2  SpO2 100%  LMP 10/26/2011  Physical Exam  Nursing note and vitals reviewed. Constitutional: She appears well-developed and well-nourished. No distress.  HENT:  Head: Normocephalic and atraumatic.  Mouth/Throat: Oropharynx is clear and moist. No oropharyngeal exudate.       Dental Disease - right upper premolar absent with blood clot in the socket, no surrounding abscess, fluctuance erythema or swelling.  Eyes: Conjunctivae are normal. No scleral icterus.  Neck: Normal range of motion. Neck supple. No thyromegaly present.  Cardiovascular: Normal rate and regular rhythm.   Pulmonary/Chest: Effort normal and breath sounds normal.  Lymphadenopathy:    She has no cervical adenopathy.  Neurological: She is alert.  Skin: Skin is warm and dry. No rash noted. She is not diaphoretic.    ED Course  Dental Performed by: Vida Roller Authorized by: Eber Hong D Consent: Verbal consent obtained. Risks and benefits: risks, benefits and alternatives were discussed Consent given by: patient Patient understanding: patient states understanding of the procedure being performed Patient consent: the patient's understanding of the procedure matches consent given Procedure consent: procedure consent matches procedure scheduled Relevant documents: relevant documents present and verified Test results: test results available and properly labeled Site marked: the operative site was marked Imaging studies: imaging studies available Patient identity confirmed: verbally  with patient and arm band Time out: Immediately prior to procedure a "time out" was called to verify the correct patient, procedure, equipment, support staff and site/side marked as required. Preparation: Patient was prepped and draped in the usual sterile fashion. Local anesthesia used: yes Anesthesia: nerve block Local anesthetic: lidocaine 1% with epinephrine Anesthetic total: 2 ml Patient sedated: no Patient tolerance: Patient tolerated the procedure well with no immediate complications. Comments: Maxillary nerve block   (including critical care time)  Labs Reviewed - No data to display No results found.   1. Pain, dental       MDM  Blood clot in the socket of first molar, upper right jaw. I have removed the blood clot after local anesthesia, has packed with gauze, patient biting down at this time with good improvement. Will send home with pain medication and instructions for followup.      Discharge prescription  #1 Percocet, 12 tablets  Vida Roller, MD 10/27/11 [phone]

## 2011-10-27 NOTE — ED Notes (Signed)
C/o right upper mouth pain after having tooth extracted this afternoon

## 2011-10-29 ENCOUNTER — Emergency Department (HOSPITAL_BASED_OUTPATIENT_CLINIC_OR_DEPARTMENT_OTHER)
Admission: EM | Admit: 2011-10-29 | Discharge: 2011-10-29 | Disposition: A | Discharge status: Home / Self Care | Payer: Medicaid Other | Attending: Emergency Medicine | Admitting: Emergency Medicine

## 2011-10-29 ENCOUNTER — Encounter (HOSPITAL_BASED_OUTPATIENT_CLINIC_OR_DEPARTMENT_OTHER): Payer: Self-pay | Admitting: *Deleted

## 2011-10-29 DIAGNOSIS — Z79899 Other long term (current) drug therapy: Secondary | ICD-10-CM | POA: Insufficient documentation

## 2011-10-29 DIAGNOSIS — G8918 Other acute postprocedural pain: Secondary | ICD-10-CM

## 2011-10-29 DIAGNOSIS — K089 Disorder of teeth and supporting structures, unspecified: Secondary | ICD-10-CM | POA: Insufficient documentation

## 2011-10-29 DIAGNOSIS — Z21 Asymptomatic human immunodeficiency virus [HIV] infection status: Secondary | ICD-10-CM | POA: Insufficient documentation

## 2011-10-29 MED ORDER — HYDROCODONE-ACETAMINOPHEN 5-325 MG PO TABS: 2.0000 | ORAL_TABLET | ORAL | Status: AC | PRN

## 2011-10-29 MED ORDER — CLINDAMYCIN HCL 300 MG PO CAPS: 300.0000 mg | ORAL_CAPSULE | Freq: Four times a day (QID) | ORAL | Status: AC

## 2011-10-29 NOTE — ED Provider Notes (Signed)
History     CSN: [id number]  Arrival date & time 10/29/11  8657   First MD Initiated Contact with Patient 10/29/11 2156      Chief Complaint  Patient presents with  . Dental Pain    (Consider location/radiation/quality/duration/timing/severity/associated sxs/prior treatment) Patient is a 26 y.o. female presenting with tooth pain. The history is provided by the patient. No language interpreter was used.  Dental PainThe primary symptoms include mouth pain. The symptoms began more than 1 week ago. The symptoms are worsening. The symptoms are recurrent. The symptoms occur constantly.  Additional symptoms include: gum swelling and gum tenderness. Medical issues include: immunosuppression. Medical issues do not include: alcohol problem.  Pt reports she has taken percocet prescribed by Dr. Hyacinth Meeker.  Pt complains of severe pain at extraction site.  -  Past Medical History  Diagnosis Date  . HIV (human immunodeficiency virus infection)   . TMJ syndrome     Past Surgical History  Procedure Date  . Appendectomy   . Cesarean section   . Ankle surgery     History reviewed. No pertinent family history.  History  Substance Use Topics  . Smoking status: Passive Smoker  . Smokeless tobacco: Not on file  . Alcohol Use: No    OB History    Grav Para Term Preterm Abortions TAB SAB Ect Mult Living                  Review of Systems  HENT: Positive for dental problem.   All other systems reviewed and are negative.    Allergies  Penicillins; Naproxen; and Tramadol  Home Medications   Current Outpatient Rx  Name Route Sig Dispense Refill  . EMTRICITABINE-TENOFOVIR 200-300 MG PO TABS Oral Take 1 tablet by mouth daily.      Marland Kitchen FOSAMPRENAVIR CALCIUM 700 MG PO TABS Oral Take 1,400 mg by mouth 2 (two) times daily.     . IBUPROFEN 200 MG PO TABS Oral Take 800 mg by mouth 3 (three) times daily as needed. For pain     . RITONAVIR 100 MG PO CAPS Oral Take 100 mg by mouth 2 (two) times  daily.       BP 121/73  Pulse 72  Temp(Src) 98.1 F (36.7 C) (Oral)  Resp 18  Ht 5\' 5"  (1.651 m)  Wt 160 lb (72.576 kg)  BMI 26.63 kg/m2  SpO2 100%  LMP 10/26/2011  Physical Exam  Nursing note and vitals reviewed. Constitutional: She appears well-developed and well-nourished.  HENT:  Head: Normocephalic and atraumatic.  Right Ear: External ear normal.  Left Ear: External ear normal.  Mouth/Throat: Oropharynx is clear and moist.       Extraction site looks good,  No sign of abscess  Eyes: Pupils are equal, round, and reactive to light.  Neck: Normal range of motion.  Cardiovascular: Normal rate.   Pulmonary/Chest: Effort normal.  Musculoskeletal: Normal range of motion.  Neurological: She is alert.  Skin: Skin is warm.    ED Course  Procedures (including critical care time)  Labs Reviewed - No data to display No results found.   No diagnosis found.    MDM  Pt has had multiple ED visits for dental pain.  Pt has cause for pain.  I will give pain medication for weekend.  I advised pt she needs to see dentist on Monday.  I counseled pt that I will treat acute pain however she needs to discuss narcotic use with her MD.  Pt  understands that if she requires on going pain management this needs to be done by her MD. (ED can not continue to write her RX's for pain medication)  Pt has had 8 pain related visits in 4 months       Langston Masker, [place] 10/29/11 2213

## 2011-10-29 NOTE — ED Notes (Signed)
Pt states she was here Thursday and "had a clot where her tooth was pulled". Now her face is swollen and she has increased pain. Appt Wed. "Dentist told me I had an abscess." Also c/o N/V Unable to eat.

## 2011-10-30 NOTE — ED Provider Notes (Signed)
Medical screening examination/treatment/procedure(s) were performed by non-physician practitioner and as supervising physician I was immediately available for consultation/collaboration.    Nelia Shi, MD 10/30/11 [phone]

## 2011-12-06 ENCOUNTER — Encounter (HOSPITAL_BASED_OUTPATIENT_CLINIC_OR_DEPARTMENT_OTHER): Payer: Self-pay | Admitting: Emergency Medicine

## 2011-12-06 ENCOUNTER — Emergency Department (HOSPITAL_BASED_OUTPATIENT_CLINIC_OR_DEPARTMENT_OTHER)
Admission: EM | Admit: 2011-12-06 | Discharge: 2011-12-06 | Disposition: A | Discharge status: Home / Self Care | Payer: Medicaid Other | Attending: Emergency Medicine | Admitting: Emergency Medicine

## 2011-12-06 DIAGNOSIS — Z880 Allergy status to penicillin: Secondary | ICD-10-CM | POA: Insufficient documentation

## 2011-12-06 DIAGNOSIS — K068 Other specified disorders of gingiva and edentulous alveolar ridge: Secondary | ICD-10-CM

## 2011-12-06 DIAGNOSIS — M2669 Other specified disorders of temporomandibular joint: Secondary | ICD-10-CM | POA: Insufficient documentation

## 2011-12-06 DIAGNOSIS — Z9089 Acquired absence of other organs: Secondary | ICD-10-CM | POA: Insufficient documentation

## 2011-12-06 DIAGNOSIS — Z21 Asymptomatic human immunodeficiency virus [HIV] infection status: Secondary | ICD-10-CM | POA: Insufficient documentation

## 2011-12-06 DIAGNOSIS — K089 Disorder of teeth and supporting structures, unspecified: Secondary | ICD-10-CM | POA: Insufficient documentation

## 2011-12-06 MED ORDER — HYDROCODONE-ACETAMINOPHEN 5-325 MG PO TABS: 2.0000 | ORAL_TABLET | ORAL | Status: AC | PRN

## 2011-12-06 MED ORDER — CHLORHEXIDINE GLUCONATE 0.12 % MT SOLN: 15.0000 mL | Freq: Two times a day (BID) | OROMUCOSAL | Status: DC

## 2011-12-06 MED ORDER — CHLORHEXIDINE GLUCONATE 0.12 % MT SOLN: 15.0000 mL | Freq: Two times a day (BID) | OROMUCOSAL | Status: AC

## 2011-12-06 NOTE — ED Notes (Signed)
Pt reports persistent pain to RT upper gum x 1 s/p extraction

## 2011-12-06 NOTE — ED Notes (Signed)
Pt stats she had teeth pulled in January and had bone come from her gums last week. C.o pain to her right upper molar.

## 2011-12-06 NOTE — Discharge Instructions (Signed)
Gum Disease Gum disease is an infection of the tissues that surround and support the teeth (periodontium). This includes the gums, connective tissue fibers (ligaments), and the thickened ridges of the tooth bone (sockets). The disease is caused by germs (bacteria) that grow in soft deposits (plaque) on the teeth. This results in redness, soreness, and swelling (inflammation). This inflammation causes the gums to bleed. If left untreated, it can lead to damage of the tissues and supportive bone. Although bacteria are known as the major cause of gum disease, other risk factors include tobacco use, diabetes, certain medications, hormones, pregnancy, and genetic factors. SYMPTOMS   Gums that bleed easily.   Red or swollen gums.   Bad breath that does not go away.   Gums that have pulled away from the teeth.   Loose or separating permanent teeth.   Painful chewing.   Changes in the way your teeth fit together.  DIAGNOSIS  A thorough exam will be performed by a dentist to determine the presence and stage of gum disease. The stage is how far the gum disease has developed. TREATMENT  Treatment is based on the stages of gum disease. The stages include:  Mild. If it is caught early, conditions can improve by brushing and flossing properly.   Moderate. You may need special cleaning (scaling and root planing). This method removes plaque and hardened plaque (tartar) above and below the gum line. Medication may also be used to treat moderate gum disease.   Severe. This stage requires surgery of the gums and supporting bone.  PREVENTION  You can prevent gum disease by:  Practicing good oral hygiene, including brushing and flossing properly.   Avoiding use of tobacco products.   Scheduling regular dental check-ups and cleanings.   Eating a well-balanced diet.  SEEK IMMEDIATE DENTAL OR MEDICAL CARE IF:  You have fever over 102 F (38.9 C).   You have swelling of your face, neck, or jaw.    You are unable to open your mouth.   You have severe pain not controlled by pain medicine.  Document Released: 02/23/2010 Document Revised: 08/25/2011 Document Reviewed: 02/23/2010 [facility] Patient Information 2012 [place], [place].

## 2011-12-06 NOTE — ED Provider Notes (Signed)
History     CSN: [id number]  Arrival date & time 12/06/11  1734   First MD Initiated Contact with Patient 12/06/11 1927      Chief Complaint  Patient presents with  . Dental Pain    (Consider location/radiation/quality/duration/timing/severity/associated sxs/prior treatment) Patient is a 26 y.o. female presenting with tooth pain. The history is provided by the patient. No language interpreter was used.  Dental PainThe primary symptoms include mouth pain. The symptoms began more than 1 week ago. The symptoms are worsening. The symptoms are recurrent. The symptoms occur constantly.  Medical issues do not include: alcohol problem.  Pt here with gum pain.  Pt has had multi visits for gum pain and tooth pain.     Past Medical History  Diagnosis Date  . HIV (human immunodeficiency virus infection)   . TMJ syndrome     Past Surgical History  Procedure Date  . Appendectomy   . Cesarean section   . Ankle surgery     No family history on file.  History  Substance Use Topics  . Smoking status: Passive Smoker  . Smokeless tobacco: Not on file  . Alcohol Use: No    OB History    Grav Para Term Preterm Abortions TAB SAB Ect Mult Living                  Review of Systems  All other systems reviewed and are negative.    Allergies  Penicillins; Naproxen; and Tramadol  Home Medications   Current Outpatient Rx  Name Route Sig Dispense Refill  . EMTRICITABINE-TENOFOVIR 200-300 MG PO TABS Oral Take 1 tablet by mouth daily.     Marland Kitchen FOSAMPRENAVIR CALCIUM 700 MG PO TABS Oral Take 1,400 mg by mouth 2 (two) times daily.     . IBUPROFEN 200 MG PO TABS Oral Take 600-800 mg by mouth 3 (three) times daily as needed. For pain     . RITONAVIR 100 MG PO CAPS Oral Take 100 mg by mouth 2 (two) times daily.     . CHLORHEXIDINE GLUCONATE 0.12 % MT SOLN Mouth/Throat Use as directed 15 mLs in the mouth or throat 2 (two) times daily. 120 mL 0  . HYDROCODONE-ACETAMINOPHEN 5-325 MG PO TABS Oral  Take 2 tablets by mouth every 4 (four) hours as needed for pain. 10 tablet 0    BP 115/76  Pulse 90  Temp(Src) 98.2 F (36.8 C) (Oral)  Resp 16  SpO2 97%  LMP 11/29/2011  Physical Exam  Vitals reviewed. Constitutional: She is oriented to person, place, and time. She appears well-developed and well-nourished.  HENT:  Head: Normocephalic and atraumatic.  Right Ear: External ear normal.  Left Ear: External ear normal.  Nose: Nose normal.  Mouth/Throat: Oropharynx is clear and moist.  Eyes: Conjunctivae are normal. Pupils are equal, round, and reactive to light.  Neck: Normal range of motion. Neck supple.  Cardiovascular: Normal rate.   Pulmonary/Chest: Effort normal.  Abdominal: Soft.  Musculoskeletal: Normal range of motion.  Neurological: She is alert and oriented to person, place, and time.  Skin: Skin is warm.  Psychiatric: She has a normal mood and affect.    ED Course  Procedures (including critical care time)  Labs Reviewed - No data to display No results found.   1. Pain in gums       MDM  Pt given rx for peridex and 10 hydrocodone      Lonia Skinner [place], [place] 12/06/11 2002

## 2011-12-07 NOTE — ED Provider Notes (Signed)
Medical screening examination/treatment/procedure(s) were performed by non-physician practitioner and as supervising physician I was immediately available for consultation/collaboration.   Forbes Cellar, MD 12/07/11 0021

## 2011-12-24 ENCOUNTER — Emergency Department (HOSPITAL_BASED_OUTPATIENT_CLINIC_OR_DEPARTMENT_OTHER)
Admission: EM | Admit: 2011-12-24 | Discharge: 2011-12-24 | Disposition: A | Discharge status: Home / Self Care | Payer: Medicaid Other | Attending: Emergency Medicine | Admitting: Emergency Medicine

## 2011-12-24 ENCOUNTER — Encounter (HOSPITAL_BASED_OUTPATIENT_CLINIC_OR_DEPARTMENT_OTHER): Payer: Self-pay | Admitting: *Deleted

## 2011-12-24 ENCOUNTER — Emergency Department (INDEPENDENT_AMBULATORY_CARE_PROVIDER_SITE_OTHER): Payer: Medicaid Other

## 2011-12-24 DIAGNOSIS — S60229A Contusion of unspecified hand, initial encounter: Secondary | ICD-10-CM | POA: Insufficient documentation

## 2011-12-24 DIAGNOSIS — M79609 Pain in unspecified limb: Secondary | ICD-10-CM | POA: Insufficient documentation

## 2011-12-24 DIAGNOSIS — Z21 Asymptomatic human immunodeficiency virus [HIV] infection status: Secondary | ICD-10-CM | POA: Insufficient documentation

## 2011-12-24 DIAGNOSIS — Z79899 Other long term (current) drug therapy: Secondary | ICD-10-CM | POA: Insufficient documentation

## 2011-12-24 DIAGNOSIS — IMO0002 Reserved for concepts with insufficient information to code with codable children: Secondary | ICD-10-CM | POA: Insufficient documentation

## 2011-12-24 DIAGNOSIS — S6990XA Unspecified injury of unspecified wrist, hand and finger(s), initial encounter: Secondary | ICD-10-CM

## 2011-12-24 DIAGNOSIS — X58XXXA Exposure to other specified factors, initial encounter: Secondary | ICD-10-CM

## 2011-12-24 DIAGNOSIS — M7989 Other specified soft tissue disorders: Secondary | ICD-10-CM | POA: Insufficient documentation

## 2011-12-24 NOTE — Discharge Instructions (Signed)
Contusion  A contusion is a deep bruise. Contusions happen when an injury causes bleeding under the skin. Signs of bruising include pain, puffiness (swelling), and discolored skin. The contusion may turn blue, purple, or yellow.  HOME CARE    Put ice on the injured area.   Put ice in a plastic bag.   Place a towel between your skin and the bag.   Leave the ice on for 15 to 20 minutes, 3 to 4 times a day.   Only take medicine as told by your doctor.   Rest the injured area.   If possible, raise (elevate) the injured area to lessen puffiness.  GET HELP RIGHT AWAY IF:    You have more bruising or puffiness.   You have pain that is getting worse.   Your puffiness or pain is not helped by medicine.  MAKE SURE YOU:    Understand these instructions.   Will watch your condition.   Will get help right away if you are not doing well or get worse.  Document Released: 02/22/2008 Document Revised: 08/25/2011 Document Reviewed: 07/11/2011  [employer] Patient Information 2012 [employer].
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
## 2011-12-24 NOTE — ED Notes (Signed)
D/c home with family to drive- ice pack given for home use- no new rx given

## 2012-01-19 ENCOUNTER — Emergency Department (HOSPITAL_BASED_OUTPATIENT_CLINIC_OR_DEPARTMENT_OTHER)
Admission: EM | Admit: 2012-01-19 | Discharge: 2012-01-19 | Disposition: A | Discharge status: Home / Self Care | Payer: Medicaid Other | Attending: Emergency Medicine | Admitting: Emergency Medicine

## 2012-01-19 ENCOUNTER — Encounter (HOSPITAL_BASED_OUTPATIENT_CLINIC_OR_DEPARTMENT_OTHER): Payer: Self-pay | Admitting: *Deleted

## 2012-01-19 DIAGNOSIS — K089 Disorder of teeth and supporting structures, unspecified: Secondary | ICD-10-CM | POA: Insufficient documentation

## 2012-01-19 DIAGNOSIS — M26609 Unspecified temporomandibular joint disorder, unspecified side: Secondary | ICD-10-CM | POA: Insufficient documentation

## 2012-01-19 DIAGNOSIS — Z21 Asymptomatic human immunodeficiency virus [HIV] infection status: Secondary | ICD-10-CM | POA: Insufficient documentation

## 2012-01-19 DIAGNOSIS — Z9089 Acquired absence of other organs: Secondary | ICD-10-CM | POA: Insufficient documentation

## 2012-01-19 DIAGNOSIS — K0889 Other specified disorders of teeth and supporting structures: Secondary | ICD-10-CM

## 2012-01-19 MED ORDER — HYDROCODONE-ACETAMINOPHEN 5-325 MG PO TABS: 2.0000 | ORAL_TABLET | Freq: Once | ORAL | Status: DC

## 2012-01-19 MED ORDER — HYDROCODONE-ACETAMINOPHEN 5-325 MG PO TABS: 2.0000 | ORAL_TABLET | ORAL | Status: AC | PRN

## 2012-01-19 NOTE — ED Notes (Signed)
Pt states that she was seen by her dentist today and had a procedure states that she was doing fine until she ate dinner and she began to have pain states that it feels like her face is on fire. Pt with left lower jaw pain

## 2012-01-19 NOTE — ED Notes (Signed)
Pt was ordered vicodin x 2 tab here, but pt is driving self.

## 2012-01-19 NOTE — Discharge Instructions (Signed)
Dental Extraction   A dental extraction procedure refers to a routine tooth extraction performed by your dentist. The procedure depends on where and how the tooth is positioned. The procedure can be very quick, sometimes lasting only seconds. Reasons for dental extraction include:   Tooth decay.   Infections (abcesses).   The need to make room for other teeth.   Gum disease s where the supporting bone has been destroyed.   Fractures of the tooth leaving it unrestorable.   Extra teeth (supernumerary) or grossly malformed teeth.   Baby teeth that have not fallen out in time and have not permitted the the permanent teeth to erupt properly.   In preparation for braces where there is not enough room to align the teeth properly.   Not enough room for wisdom teeth (particularly those that are impacted).   Prior to receiving radiation to the head and neck, teeth in the field of radiation may need to be extracted.   LET YOUR CAREGIVER KNOW ABOUT:   Any allergies.   All medicines you are taking:   Including herbs, eye drops, over-the-counter medications, and creams.   Blood thinners (anticoagulants), aspirin or other drugs that may affect blood clotting.   Use of steroids (through mouth or as creams).   Previous problems with anesthetics, including local anesthetics.   History of bleeding or blood problems.   Previous surgery.   Possibility of pregnancy if this applies.   Smoking history.   Any health problems.   RISKS AND COMPLICATIONS   As with any procedure, complications may occur, but they can usually be managed by your caregiver. General surgical complications may include:   Reaction to anesthesia.   Damage to surrounding teeth, nerves, tissues, or structures.   Infection.   Bleeding.   With appropriate treatment and care after surgery, the following complications are very uncommon:   Dry socket (blood clot does not form or stay in place over empty socket). This can delay healing.   Incomplete extraction of roots.    Jawbone injury, pain, or weakness.   BEFORE THE PROCEDURE   Your dental care provider will:   Take a medical and dental history.   Take an X-ray to evaluate the circumstances and how to best extract the tooth.   Do an oral exam.   Depending on the situation, antibiotics may be given before or after the extraction, or before and after.   Your caregivers may review the procedure, the local anaesthesia and/or sedation being used, and what to expect after the procedure with you.   If needed, your dentist may give you a form of sedation, either by medicine you swallow, gas, or intravenously (IV). This will help to relieve anxiety. Complicated extractions may require the use of general anaesthesia.   It is important to follow your caregiver's instructions prior to your procedure to avoid complications. Steps before your procedure may include:   Alert your caregiver if you feel ill (sore throat, fever, upset stomach, etc.) in the days leading up to your procedure.   Stop taking certain medications for several days prior to your procedure such as blood thinners.   Take certain medications, such as antibiotics.   Avoid eating and drinking for several hours before the procedure. This will help you to avoid complications from the sedation or anaesthesia.   Sign a patient consent form.   Have a friend or family member drive you to the dentist and drive you home after the procedure.     Wear comfortable, loose clothing. Limit makeup and jewelry.   Quit smoking. If you are a smoker, this will raise the chances of a healing problem after your procedure. If you are thinking about quitting, talk to your surgeon about how long before the operation you should stop smoking. You may also get help from your primary caregiver.   PROCEDURE   Dental extraction is typically done as an outpatient procedure. IV sedation, local anesthesia, or both may be used. It will keep you comfortable and free of pain during the procedure.   There are 2  types of extractions:   Simple extraction involves a tooth that is visible in the mouth and above the gum line. After local anesthetic is given by injection, and the area is numbed, the dentist will loosen the tooth with a special instrument (elevator). Then another instrument (forceps) will be used to grasp the tooth and remove it from its socket. During the procedure you will feel some pressure, but you should not feel pain. If you do feel pain, tell your dentist. The open socket will be cleaned. Dressings (gauze) will be placed in the socket to reduce bleeding.   Surgical extractions are used if the tooth has not come into the mouth or the tooth is broken off below the gum line. The dentist will make a cut (incision) in the gum and may have to remove some of the bone around the tooth to aid in the removal of the tooth. After removal, stitches (sutures) may be required to close area to help in healing and control bleeding. For some surgical extractions, you may need a general anesthetic or IV sedation (through the vein).   After both types of extractions, you may be given pain medication or other drugs to help healing. Other post operative instructions will be given by your dental caregiver.   AFTER THE PROCEDURE   You will have gauze in your mouth where the tooth was removed. Gentle pressure on the gauze for up to 1 hour will help to control bleeding.   A blood clot will begin to form over the open socket. This is normal. Do not touch the area or rinse it.   Your pain will be controlled with medication and self-care.   You will be given detailed instructions for care after surgery.   PROGNOSIS   While some discomfort is normal after tooth extraction, most patients recover fully in just a few days.   SEEK IMMEDIATE DENTAL CARE   You have uncontrolled bleeding, marked swelling, or severe pain.   You develop a fever, difficulty swallowing, or other severe symptoms.   You have questions or concerns.   Document  Released: 09/05/2005 Document Revised: 08/25/2011 Document Reviewed: 12/10/2010   Exit[employer]® Patient Information ©2012 [employer].

## 2012-01-19 NOTE — ED Provider Notes (Signed)
History     CSN: [id number]  Arrival date & time 01/19/12  2132   First MD Initiated Contact with Patient 01/19/12 2230      Chief Complaint  Patient presents with  . Dental Pain    (Consider location/radiation/quality/duration/timing/severity/associated sxs/prior treatment) Patient is a 26 y.o. female presenting with tooth pain. The history is provided by the patient. No language interpreter was used.  Dental PainThe primary symptoms include mouth pain. The symptoms began less than 1 hour ago. The symptoms are worsening. The symptoms are new. The symptoms occur constantly.  Pt reports she had a tooth extracted by her dentist today.  Pt complains of pain at site.  Pt reports pain began after eating dinner.  Past Medical History  Diagnosis Date  . HIV (human immunodeficiency virus infection)   . TMJ syndrome     Past Surgical History  Procedure Date  . Appendectomy   . Cesarean section   . Ankle surgery   . Tubal ligation     History reviewed. No pertinent family history.  History  Substance Use Topics  . Smoking status: Passive Smoker  . Smokeless tobacco: Not on file  . Alcohol Use: No    OB History    Grav Para Term Preterm Abortions TAB SAB Ect Mult Living                  Review of Systems  All other systems reviewed and are negative.    Allergies  Penicillins; Naproxen; and Tramadol  Home Medications   Current Outpatient Rx  Name Route Sig Dispense Refill  . EMTRICITABINE-TENOFOVIR 200-300 MG PO TABS Oral Take 1 tablet by mouth daily.     Marland Kitchen FOSAMPRENAVIR CALCIUM 700 MG PO TABS Oral Take 1,400 mg by mouth 2 (two) times daily.     . IBUPROFEN 200 MG PO TABS Oral Take 600-800 mg by mouth 3 (three) times daily as needed. For pain     . RITONAVIR 100 MG PO CAPS Oral Take 100 mg by mouth 2 (two) times daily.     Marland Kitchen HYDROCODONE-ACETAMINOPHEN 5-325 MG PO TABS Oral Take 2 tablets by mouth every 4 (four) hours as needed for pain. 10 tablet 0    BP 133/80   Pulse 74  Temp(Src) 98.6 F (37 C) (Oral)  Resp 20  SpO2 99%  LMP 12/20/2011  Physical Exam  Nursing note and vitals reviewed. Constitutional: She appears well-developed and well-nourished.  HENT:  Head: Normocephalic and atraumatic.  Mouth/Throat: Oropharynx is clear and moist.       Sutures at extraction site  Eyes: Pupils are equal, round, and reactive to light.  Neck: Normal range of motion.  Musculoskeletal: Normal range of motion.  Neurological: She is alert.  Skin: Skin is warm.    ED Course  Procedures (including critical care time)  Labs Reviewed - No data to display No results found.   1. Pain, dental       MDM  Pt is here frequently for dental complaints.  Pt reports no pain medication given at dentist.   I will treat with hydrocodone.   Pt is crying with pain.  (I advised pt to call her dentist tomorrow)   Pt reports her dentist does not work on Fridays.          Lonia Skinner [place], [place] 01/19/12 2250

## 2012-01-20 NOTE — ED Provider Notes (Signed)
Medical screening examination/treatment/procedure(s) were performed by non-physician practitioner and as supervising physician I was immediately available for consultation/collaboration.   Hanley Seamen, MD 01/20/12 [phone]

## 2012-02-10 ENCOUNTER — Emergency Department (HOSPITAL_BASED_OUTPATIENT_CLINIC_OR_DEPARTMENT_OTHER)
Admission: EM | Admit: 2012-02-10 | Discharge: 2012-02-10 | Disposition: A | Discharge status: Home / Self Care | Payer: Medicaid Other | Attending: Emergency Medicine | Admitting: Emergency Medicine

## 2012-02-10 ENCOUNTER — Encounter (HOSPITAL_BASED_OUTPATIENT_CLINIC_OR_DEPARTMENT_OTHER): Payer: Self-pay | Admitting: *Deleted

## 2012-02-10 DIAGNOSIS — K089 Disorder of teeth and supporting structures, unspecified: Secondary | ICD-10-CM | POA: Insufficient documentation

## 2012-02-10 DIAGNOSIS — Z87891 Personal history of nicotine dependence: Secondary | ICD-10-CM | POA: Insufficient documentation

## 2012-02-10 DIAGNOSIS — K1379 Other lesions of oral mucosa: Secondary | ICD-10-CM

## 2012-02-10 DIAGNOSIS — Z21 Asymptomatic human immunodeficiency virus [HIV] infection status: Secondary | ICD-10-CM | POA: Insufficient documentation

## 2012-02-10 HISTORY — DX: Unspecified convulsions: R56.9

## 2012-02-10 HISTORY — DX: Localization-related (focal) (partial) symptomatic epilepsy and epileptic syndromes with simple partial seizures, not intractable, without status epilepticus: G40.109

## 2012-02-10 MED ORDER — HYDROCODONE-ACETAMINOPHEN 5-325 MG PO TABS: 2.0000 | ORAL_TABLET | ORAL | Status: AC | PRN

## 2012-02-10 MED ORDER — CLINDAMYCIN HCL 300 MG PO CAPS: 300.0000 mg | ORAL_CAPSULE | Freq: Four times a day (QID) | ORAL | Status: AC

## 2012-02-10 NOTE — ED Provider Notes (Signed)
History     CSN: [id number]  Arrival date & time 02/10/12  1254   First MD Initiated Contact with Patient 02/10/12 1329      Chief Complaint  Patient presents with  . Dental Pain    (Consider location/radiation/quality/duration/timing/severity/associated sxs/prior treatment) Patient is a 26 y.o. female presenting with dental injury. The history is provided by the patient. No language interpreter was used.  Dental Injury This is a recurrent problem. The current episode started today. The problem occurs constantly. The symptoms are aggravated by nothing. She has tried nothing for the symptoms. The treatment provided no relief.  Pt complains of pain to her left gumline where tooth was extracted.  Pt reports she thinks area is infected  Past Medical History  Diagnosis Date  . HIV (human immunodeficiency virus infection)   . TMJ syndrome   . Simple seizure     r/t taking tramadol    Past Surgical History  Procedure Date  . Appendectomy   . Cesarean section   . Ankle surgery   . Tubal ligation   . Fracture surgery     No family history on file.  History  Substance Use Topics  . Smoking status: Passive Smoker  . Smokeless tobacco: Not on file  . Alcohol Use: No    OB History    Grav Para Term Preterm Abortions TAB SAB Ect Mult Living                  Review of Systems  HENT: Positive for dental problem.   All other systems reviewed and are negative.    Allergies  Penicillins; Naproxen; and Tramadol  Home Medications   Current Outpatient Rx  Name Route Sig Dispense Refill  . EMTRICITABINE-TENOFOVIR 200-300 MG PO TABS Oral Take 1 tablet by mouth daily.     Marland Kitchen FOSAMPRENAVIR CALCIUM 700 MG PO TABS Oral Take 1,400 mg by mouth 2 (two) times daily.     . IBUPROFEN 200 MG PO TABS Oral Take 600-800 mg by mouth 3 (three) times daily as needed. For pain     . RITONAVIR 100 MG PO CAPS Oral Take 100 mg by mouth 2 (two) times daily.       BP 126/87  Pulse 68   Temp(Src) 98.6 F (37 C) (Oral)  Resp 20  Ht 5\' 5"  (1.651 m)  Wt 150 lb (68.04 kg)  BMI 24.96 kg/m2  SpO2 100%  LMP 01/27/2012  Physical Exam  Vitals reviewed. Constitutional: She appears well-developed and well-nourished.  HENT:  Head: Normocephalic and atraumatic.       Healing excision site,  No sign of abscess  Eyes: Conjunctivae and EOM are normal. Pupils are equal, round, and reactive to light.  Cardiovascular: Normal rate.   Pulmonary/Chest: Effort normal.  Musculoskeletal: Normal range of motion.  Neurological: She is alert.  Skin: Skin is warm.  Psychiatric: She has a normal mood and affect.    ED Course  Procedures (including critical care time)  Labs Reviewed - No data to display No results found.   1. Mouth pain       MDM  RX clindamycin Hydrocodone 10 tablets       Lonia Skinner [place], [place] 02/10/12 1357  Lonia Skinner [place], [place] 02/10/12 1359

## 2012-02-10 NOTE — ED Notes (Signed)
Patient states she had dental extractions on her left lower jaw approximately one month ago.  States she had the stitches removed 3 weeks ago.  States the jaw is not healing.  States she woke up this morning with severe pain in her jaw.  Patient is HIV positive and is concerned.

## 2012-02-10 NOTE — Discharge Instructions (Signed)
Dental Care and Dentist Visits   Dental care supports good overall health. Regular dental visits can also help you avoid dental pain, bleeding, infection, and other more serious health problems in the future. It is important to keep the mouth healthy because diseases in the teeth, gums, and other oral tissues can spread to other areas of the body. Some problems, such as diabetes, heart disease, and pre-term labor have been associated with poor oral health.   See your dentist every 6 months. If you experience emergency problems such as a toothache or broken tooth, go to the dentist right away. If you see your dentist regularly, you may catch problems early. It is easier to be treated for problems in the early stages.   WHAT TO EXPECT AT A DENTIST VISIT   Your dentist will look for many common oral health problems and recommend proper treatment. At your regular dental visit, you can expect:   Gentle cleaning of the teeth and gums. This includes scraping and polishing. This helps to remove the sticky substance around the teeth and gums (plaque). Plaque forms in the mouth shortly after eating. Over time, plaque hardens on the teeth as tartar. If tartar is not removed regularly, it can cause problems. Cleaning also helps remove stains.   Periodic X-rays. These pictures of the teeth and supporting bone will help your dentist assess the health of your teeth.   Periodic fluoride treatments. Fluoride is a natural mineral shown to help strengthen teeth. Fluoride treatment involves applying a fluoride gel or varnish to the teeth. It is most commonly done in children.   Examination of the mouth, tongue, jaws, teeth, and gums to look for any oral health problems, such as:   Cavities (dental caries). This is decay on the tooth caused by plaque, sugar, and acid in the mouth. It is best to catch a cavity when it is small.   Inflammation of the gums caused by plaque buildup (gingivitis).   Problems with the mouth or malformed or  misaligned teeth.   Oral cancer or other diseases of the soft tissues or jaws.   KEEP YOUR TEETH AND GUMS HEALTHY   For healthy teeth and gums, follow these general guidelines as well as your dentist's specific advice:   Have your teeth professionally cleaned at the dentist every 6 months.   Brush twice daily with a fluoride toothpaste.   Floss your teeth daily.   Ask your dentist if you need fluoride supplements, treatments, or fluoride toothpaste.   Eat a healthy diet. Reduce foods and drinks with added sugar.   Avoid smoking.  TREATMENT FOR ORAL HEALTH PROBLEMS   If you have oral health problems, treatment varies depending on the conditions present in your teeth and gums.   Your caregiver will most likely recommend good oral hygiene at each visit.   For cavities, gingivitis, or other oral health disease, your caregiver will perform a procedure to treat the problem. This is typically done at a separate appointment. Sometimes your caregiver will refer you to another dental specialist for specific tooth problems or for surgery.  SEEK IMMEDIATE DENTAL CARE IF:   You have pain, bleeding, or soreness in the gum, tooth, jaw, or mouth area.   A permanent tooth becomes loose or separated from the gum socket.   You experience a blow or injury to the mouth or jaw area.  Document Released: 05/18/2011 Document Revised: 08/25/2011 Document Reviewed: 05/18/2011   Exit[employer] Patient Information 2012 [employer].

## 2012-02-15 NOTE — ED Provider Notes (Signed)
Medical screening examination/treatment/procedure(s) were performed by non-physician practitioner and as supervising physician I was immediately available for consultation/collaboration.   Cyndra Numbers, MD 02/15/12 0730

## 2012-02-17 ENCOUNTER — Emergency Department (HOSPITAL_BASED_OUTPATIENT_CLINIC_OR_DEPARTMENT_OTHER): Payer: Medicaid Other

## 2012-02-17 ENCOUNTER — Emergency Department (HOSPITAL_BASED_OUTPATIENT_CLINIC_OR_DEPARTMENT_OTHER)
Admission: EM | Admit: 2012-02-17 | Discharge: 2012-02-17 | Disposition: A | Discharge status: Home / Self Care | Payer: Medicaid Other | Attending: Emergency Medicine | Admitting: Emergency Medicine

## 2012-02-17 ENCOUNTER — Encounter (HOSPITAL_BASED_OUTPATIENT_CLINIC_OR_DEPARTMENT_OTHER): Payer: Self-pay

## 2012-02-17 DIAGNOSIS — A499 Bacterial infection, unspecified: Secondary | ICD-10-CM | POA: Insufficient documentation

## 2012-02-17 DIAGNOSIS — B9689 Other specified bacterial agents as the cause of diseases classified elsewhere: Secondary | ICD-10-CM | POA: Insufficient documentation

## 2012-02-17 DIAGNOSIS — N760 Acute vaginitis: Secondary | ICD-10-CM | POA: Insufficient documentation

## 2012-02-17 DIAGNOSIS — Z21 Asymptomatic human immunodeficiency virus [HIV] infection status: Secondary | ICD-10-CM | POA: Insufficient documentation

## 2012-02-17 DIAGNOSIS — R1032 Left lower quadrant pain: Secondary | ICD-10-CM | POA: Insufficient documentation

## 2012-02-17 HISTORY — DX: Unspecified ovarian cyst, unspecified side: N83.209

## 2012-02-17 HISTORY — DX: Calculus of kidney: N20.0

## 2012-02-17 LAB — WET PREP, GENITAL
Trich, Wet Prep: NONE SEEN
Yeast Wet Prep HPF POC: NONE SEEN

## 2012-02-17 LAB — URINALYSIS, ROUTINE W REFLEX MICROSCOPIC
Glucose, UA: NEGATIVE mg/dL
Leukocytes, UA: NEGATIVE
Nitrite: NEGATIVE
Specific Gravity, Urine: 1.023 (ref 1.005–1.030)
pH: 5.5 (ref 5.0–8.0)

## 2012-02-17 LAB — PREGNANCY, URINE: Preg Test, Ur: NEGATIVE

## 2012-02-17 MED ORDER — METRONIDAZOLE 500 MG PO TABS: 500.0000 mg | ORAL_TABLET | Freq: Two times a day (BID) | ORAL | Status: DC

## 2012-02-17 MED ORDER — KETOROLAC TROMETHAMINE 60 MG/2ML IM SOLN
60.0000 mg | Freq: Once | INTRAMUSCULAR | Status: AC
  Administered 2012-02-17: 60 mg via INTRAMUSCULAR

## 2012-02-17 MED ORDER — KETOROLAC TROMETHAMINE 60 MG/2ML IM SOLN
INTRAMUSCULAR | Status: AC
  Filled 2012-02-17: qty 2

## 2012-02-17 MED ORDER — HYDROCODONE-ACETAMINOPHEN 5-325 MG PO TABS: 2.0000 | ORAL_TABLET | ORAL | Status: AC | PRN

## 2012-02-17 MED ORDER — METRONIDAZOLE 500 MG PO TABS: 500.0000 mg | ORAL_TABLET | Freq: Two times a day (BID) | ORAL | Status: AC

## 2012-02-17 MED ORDER — HYDROCODONE-ACETAMINOPHEN 5-325 MG PO TABS: 2.0000 | ORAL_TABLET | ORAL | Status: DC | PRN

## 2012-02-17 NOTE — ED Provider Notes (Signed)
Medical screening examination/treatment/procedure(s) were performed by non-physician practitioner and as supervising physician I was immediately available for consultation/collaboration.  Ethelda Chick, MD 02/17/12 [phone]

## 2012-02-17 NOTE — Discharge Instructions (Signed)
Bacterial Vaginosis Bacterial vaginosis (BV) is a vaginal infection where the normal balance of bacteria in the vagina is disrupted. The normal balance is then replaced by an overgrowth of certain bacteria. There are several different kinds of bacteria that can cause BV. BV is the most common vaginal infection in women of childbearing age. CAUSES   The cause of BV is not fully understood. BV develops when there is an increase or imbalance of harmful bacteria.   Some activities or behaviors can upset the normal balance of bacteria in the vagina and put women at increased risk including:   Having a new sex partner or multiple sex partners.   Douching.   Using an intrauterine device (IUD) for contraception.   It is not clear what role sexual activity plays in the development of BV. However, women that have never had sexual intercourse are rarely infected with BV.  Women do not get BV from toilet seats, bedding, swimming pools or from touching objects around them.  SYMPTOMS   Grey vaginal discharge.   A fish-like odor with discharge, especially after sexual intercourse.   Itching or burning of the vagina and vulva.   Burning or pain with urination.   Some women have no signs or symptoms at all.  DIAGNOSIS  Your caregiver must examine the vagina for signs of BV. Your caregiver will perform lab tests and look at the sample of vaginal fluid through a microscope. They will look for bacteria and abnormal cells (clue cells), a pH test higher than 4.5, and a positive amine test all associated with BV.  RISKS AND COMPLICATIONS   Pelvic inflammatory disease (PID).   Infections following gynecology surgery.   Developing HIV.   Developing herpes virus.  TREATMENT  Sometimes BV will clear up without treatment. However, all women with symptoms of BV should be treated to avoid complications, especially if gynecology surgery is planned. Female partners generally do not need to be treated. However,  BV may spread between female sex partners so treatment is helpful in preventing a recurrence of BV.   BV may be treated with antibiotics. The antibiotics come in either pill or vaginal cream forms. Either can be used with nonpregnant or pregnant women, but the recommended dosages differ. These antibiotics are not harmful to the baby.   BV can recur after treatment. If this happens, a second round of antibiotics will often be prescribed.   Treatment is important for pregnant women. If not treated, BV can cause a premature delivery, especially for a pregnant woman who had a premature birth in the past. All pregnant women who have symptoms of BV should be checked and treated.   For chronic reoccurrence of BV, treatment with a type of prescribed gel vaginally twice a week is helpful.  HOME CARE INSTRUCTIONS   Finish all medication as directed by your caregiver.   Do not have sex until treatment is completed.   Tell your sexual partner that you have a vaginal infection. They should see their caregiver and be treated if they have problems, such as a mild rash or itching.   Practice safe sex. Use condoms. Only have 1 sex partner.  PREVENTION  Basic prevention steps can help reduce the risk of upsetting the natural balance of bacteria in the vagina and developing BV:  Do not have sexual intercourse (be abstinent).   Do not douche.   Use all of the medicine prescribed for treatment of BV, even if the signs and symptoms go away.     Tell your sex partner if you have BV. That way, they can be treated, if needed, to prevent reoccurrence.  SEEK MEDICAL CARE IF:   Your symptoms are not improving after 3 days of treatment.   You have increased discharge, pain, or fever.  MAKE SURE YOU:   Understand these instructions.   Will watch your condition.   Will get help right away if you are not doing well or get worse.  FOR MORE INFORMATION  Division of STD Prevention (DSTDP), Centers for Disease  Control and Prevention: www.cdc.gov/std [employer] Association (ASHA): www.ashastd.org  Document Released: 09/05/2005 Document Revised: 08/25/2011 Document Reviewed: 02/26/2009 Exit[employer] Patient Information 2012 [employer]. 

## 2012-02-17 NOTE — ED Notes (Signed)
Pt reports LLQ pain that started 2 days ago.

## 2012-02-17 NOTE — ED Provider Notes (Signed)
History     CSN: [id number]  Arrival date & time 02/17/12  1204   First MD Initiated Contact with Patient 02/17/12 1229      Chief Complaint  Patient presents with  . Abdominal Pain    (Consider location/radiation/quality/duration/timing/severity/associated sxs/prior treatment) Patient is a 26 y.o. female presenting with abdominal pain. The history is provided by the patient. No language interpreter was used.  Abdominal Pain The primary symptoms of the illness include abdominal pain. The current episode started 2 days ago. The onset of the illness was gradual. The problem has been gradually worsening.  The patient states that she believes she is currently not pregnant. Risk factors for an acute abdominal problem include immunosuppression. Symptoms associated with the illness do not include chills.  Pt complains of left sided pelvic pain.   Pt is concerned that she has an ovarain cyst.  Pt called her Md at [facility] who told her to come here because she would need an ultrasound.  Pt reports pain is lower left side only.  No n/v/d.  No constipation,  Appetite is normal  Past Medical History  Diagnosis Date  . HIV (human immunodeficiency virus infection)   . TMJ syndrome   . Simple seizure     r/t taking tramadol  . Ovarian cyst   . Kidney stone     Past Surgical History  Procedure Date  . Appendectomy   . Cesarean section   . Ankle surgery   . Tubal ligation   . Fracture surgery     No family history on file.  History  Substance Use Topics  . Smoking status: Passive Smoker -- 0.5 packs/day  . Smokeless tobacco: Not on file  . Alcohol Use: No    OB History    Grav Para Term Preterm Abortions TAB SAB Ect Mult Living                  Review of Systems  Constitutional: Negative for chills.  Gastrointestinal: Positive for abdominal pain.  Genitourinary: Positive for pelvic pain.  All other systems reviewed and are negative.    Allergies  Penicillins;  Naproxen; and Tramadol  Home Medications   Current Outpatient Rx  Name Route Sig Dispense Refill  . CLINDAMYCIN HCL 300 MG PO CAPS Oral Take 1 capsule (300 mg total) by mouth 4 (four) times daily. 28 capsule 0  . EMTRICITABINE-TENOFOVIR 200-300 MG PO TABS Oral Take 1 tablet by mouth daily.     Marland Kitchen FOSAMPRENAVIR CALCIUM 700 MG PO TABS Oral Take 1,400 mg by mouth 2 (two) times daily.     Marland Kitchen HYDROCODONE-ACETAMINOPHEN 5-325 MG PO TABS Oral Take 2 tablets by mouth every 4 (four) hours as needed for pain. 10 tablet 0  . IBUPROFEN 200 MG PO TABS Oral Take 600-800 mg by mouth 3 (three) times daily as needed. For pain     . RITONAVIR 100 MG PO CAPS Oral Take 100 mg by mouth 2 (two) times daily.       BP 120/85  Pulse 95  Temp(Src) 98.3 F (36.8 C) (Oral)  Resp 16  Ht 5\' 5"  (1.651 m)  Wt 150 lb (68.04 kg)  BMI 24.96 kg/m2  SpO2 97%  LMP 01/27/2012  Physical Exam  Nursing note and vitals reviewed. Constitutional: She is oriented to person, place, and time. She appears well-developed and well-nourished.  HENT:  Head: Normocephalic and atraumatic.  Eyes: Pupils are equal, round, and reactive to light.  Neck: Normal range of  motion.  Cardiovascular: Normal rate, regular rhythm and normal heart sounds.   Pulmonary/Chest: Effort normal and breath sounds normal.  Abdominal: Soft.  Genitourinary: Vaginal discharge found.       Scant discharge,  Small amount of blood.   Adnexa no mass.   Musculoskeletal: Normal range of motion.  Neurological: She is alert and oriented to person, place, and time. She has normal reflexes.  Skin: Skin is warm.  Psychiatric: She has a normal mood and affect.    ED Course  Procedures (including critical care time)   Labs Reviewed  PREGNANCY, URINE  URINALYSIS, ROUTINE W REFLEX MICROSCOPIC   No results found.   No diagnosis found.  Results for orders placed during the hospital encounter of 02/17/12  PREGNANCY, URINE      Component Value Range   Preg  Test, Ur NEGATIVE  NEGATIVE   URINALYSIS, ROUTINE W REFLEX MICROSCOPIC      Component Value Range   Color, Urine YELLOW  YELLOW    APPearance CLEAR  CLEAR    Specific Gravity, Urine 1.023  1.005 - 1.030    pH 5.5  5.0 - 8.0    Glucose, UA NEGATIVE  NEGATIVE (mg/dL)   Hgb urine dipstick NEGATIVE  NEGATIVE    Bilirubin Urine NEGATIVE  NEGATIVE    Ketones, ur NEGATIVE  NEGATIVE (mg/dL)   Protein, ur NEGATIVE  NEGATIVE (mg/dL)   Urobilinogen, UA 0.2  0.0 - 1.0 (mg/dL)   Nitrite NEGATIVE  NEGATIVE    Leukocytes, UA NEGATIVE  NEGATIVE    US Transvaginal Non-ob  02/17/2012  *RADIOLOGY REPORT*  Clinical Data: Left lower quadrant pain, prior left ovarian cyst removal  TRANSABDOMINAL AND TRANSVAGINAL ULTRASOUND OF PELVIS Technique:  Both transabdominal and transvaginal ultrasound examinations of the pelvis were performed. Transabdominal technique was performed for global imaging of the pelvis including uterus, ovaries, adnexal regions, and pelvic cul-de-sac.  Comparison: None.   It was necessary to proceed with endovaginal exam following the transabdominal exam to visualize the endometrium.  Findings:  Uterus: Normal in size and appearance, measuring 9.2 x 4.3 x 5.2 cm.  Endometrium: Normal in thickness and appearance, measuring 8 mm.  Right ovary:  Normal appearance/no adnexal mass, measuring 2.6 x 2.3 x 2.2 cm.  Left ovary: Normal appearance/no adnexal mass, measuring 2.4 x 1.8 x 2.1 cm.  Other findings: No free fluid.  IMPRESSION: Normal study.  No evidence of pelvic mass or other significant abnormality.  Original Report Authenticated By: Charline Bills, M.D.   US Pelvis Complete  02/17/2012  *RADIOLOGY REPORT*  Clinical Data: Left lower quadrant pain, prior left ovarian cyst removal  TRANSABDOMINAL AND TRANSVAGINAL ULTRASOUND OF PELVIS Technique:  Both transabdominal and transvaginal ultrasound examinations of the pelvis were performed. Transabdominal technique was performed for global imaging of  the pelvis including uterus, ovaries, adnexal regions, and pelvic cul-de-sac.  Comparison: None.   It was necessary to proceed with endovaginal exam following the transabdominal exam to visualize the endometrium.  Findings:  Uterus: Normal in size and appearance, measuring 9.2 x 4.3 x 5.2 cm.  Endometrium: Normal in thickness and appearance, measuring 8 mm.  Right ovary:  Normal appearance/no adnexal mass, measuring 2.6 x 2.3 x 2.2 cm.  Left ovary: Normal appearance/no adnexal mass, measuring 2.4 x 1.8 x 2.1 cm.  Other findings: No free fluid.  IMPRESSION: Normal study.  No evidence of pelvic mass or other significant abnormality.  Original Report Authenticated By: Charline Bills, M.D.     MDM  Ultrasound  normal.   Vitals stable,  Pt advised 24 hour recheck with her MD.   Symptoms do not sound like infection,  I doubt appendicitis,   Wet prep shows clue cells.   I will treat for BV.   Rx flagyl and hydrocodone       Lonia Skinner [place], [place] 02/17/12 1551

## 2012-03-04 ENCOUNTER — Encounter (HOSPITAL_BASED_OUTPATIENT_CLINIC_OR_DEPARTMENT_OTHER): Payer: Self-pay | Admitting: *Deleted

## 2012-03-04 ENCOUNTER — Emergency Department (HOSPITAL_BASED_OUTPATIENT_CLINIC_OR_DEPARTMENT_OTHER)
Admission: EM | Admit: 2012-03-04 | Discharge: 2012-03-04 | Disposition: A | Discharge status: Home / Self Care | Payer: Medicaid Other | Attending: Emergency Medicine | Admitting: Emergency Medicine

## 2012-03-04 ENCOUNTER — Emergency Department (HOSPITAL_BASED_OUTPATIENT_CLINIC_OR_DEPARTMENT_OTHER): Payer: Medicaid Other

## 2012-03-04 DIAGNOSIS — M79609 Pain in unspecified limb: Secondary | ICD-10-CM | POA: Insufficient documentation

## 2012-03-04 DIAGNOSIS — M79603 Pain in arm, unspecified: Secondary | ICD-10-CM

## 2012-03-04 DIAGNOSIS — Z21 Asymptomatic human immunodeficiency virus [HIV] infection status: Secondary | ICD-10-CM | POA: Insufficient documentation

## 2012-03-04 DIAGNOSIS — F17200 Nicotine dependence, unspecified, uncomplicated: Secondary | ICD-10-CM | POA: Insufficient documentation

## 2012-03-04 MED ORDER — DIAZEPAM 5 MG PO TABS: 5.0000 mg | ORAL_TABLET | Freq: Two times a day (BID) | ORAL | Status: DC

## 2012-03-04 MED ORDER — DIAZEPAM 5 MG PO TABS
5.0000 mg | ORAL_TABLET | Freq: Once | ORAL | Status: AC
  Administered 2012-03-04: 5 mg via ORAL
  Filled 2012-03-04: qty 1

## 2012-03-04 MED ORDER — PREDNISONE 50 MG PO TABS
60.0000 mg | ORAL_TABLET | Freq: Once | ORAL | Status: AC
  Administered 2012-03-04: 60 mg via ORAL
  Filled 2012-03-04: qty 1

## 2012-03-04 MED ORDER — HYDROCODONE-ACETAMINOPHEN 5-325 MG PO TABS
1.0000 | ORAL_TABLET | Freq: Once | ORAL | Status: AC
  Administered 2012-03-04: 1 via ORAL
  Filled 2012-03-04: qty 1

## 2012-03-04 MED ORDER — PREDNISONE 50 MG PO TABS: 50.0000 mg | ORAL_TABLET | Freq: Every day | ORAL | Status: DC

## 2012-03-04 MED ORDER — HYDROCODONE-ACETAMINOPHEN 5-325 MG PO TABS: 1.0000 | ORAL_TABLET | ORAL | Status: DC | PRN

## 2012-03-04 NOTE — ED Notes (Signed)
Pt states she was "wrestling around" with family member and ? Injured her left arm. No relief with OTC meds.

## 2012-03-04 NOTE — ED Provider Notes (Addendum)
History     CSN: [id number]  Arrival date & time 03/04/12  0046   First MD Initiated Contact with Patient 03/04/12 0117      Chief Complaint  Patient presents with  . Arm Pain    (Consider location/radiation/quality/duration/timing/severity/associated sxs/prior treatment) HPI Comments: Patient presents complaining that for 5 days ago she was wrestling with her fianc and her kids and sustained a left arm injury.  She does not know exactly what happened.  She states that since that time she's been trying to deal with the pain by taking ibuprofen and Goody powder but tonight the pain worsened.  She's had no other trauma or injuries.  She states that the pain goes from her left shoulder down to her hand.  There is no numbness.  Patient will not move her shoulder for me on my exam.  She's had no fevers.  She has no neck pain.  She has no prior neck or shoulder injuries.  Pain is worse with movement.  The history is provided by the patient.    Past Medical History  Diagnosis Date  . HIV (human immunodeficiency virus infection)   . TMJ syndrome   . Simple seizure     r/t taking tramadol  . Ovarian cyst   . Kidney stone     Past Surgical History  Procedure Date  . Appendectomy   . Cesarean section   . Ankle surgery   . Tubal ligation   . Fracture surgery     History reviewed. No pertinent family history.  History  Substance Use Topics  . Smoking status: Passive Smoker -- 0.5 packs/day  . Smokeless tobacco: Not on file  . Alcohol Use: No    OB History    Grav Para Term Preterm Abortions TAB SAB Ect Mult Living                  Review of Systems  Constitutional: Negative.  Negative for fever and chills.  HENT: Negative.   Eyes: Negative.   Respiratory: Negative.  Negative for cough and shortness of breath.   Cardiovascular: Negative.  Negative for chest pain.  Gastrointestinal: Negative.  Negative for nausea, vomiting, abdominal pain and diarrhea.  Genitourinary:  Negative.   Musculoskeletal: Negative for back pain.  Skin: Negative.  Negative for color change and rash.  Neurological: Negative.  Negative for syncope and headaches.  Hematological: Negative.  Negative for adenopathy.  Psychiatric/Behavioral: Negative.  Negative for confusion.  All other systems reviewed and are negative.    Allergies  Penicillins; Naproxen; and Tramadol  Home Medications   Current Outpatient Rx  Name Route Sig Dispense Refill  . DIAZEPAM 5 MG PO TABS Oral Take 1 tablet (5 mg total) by mouth 2 (two) times daily. 10 tablet 0  . EMTRICITABINE-TENOFOVIR 200-300 MG PO TABS Oral Take 1 tablet by mouth daily.     Marland Kitchen FOSAMPRENAVIR CALCIUM 700 MG PO TABS Oral Take 1,400 mg by mouth 2 (two) times daily.     Marland Kitchen HYDROCODONE-ACETAMINOPHEN 5-325 MG PO TABS Oral Take 1 tablet by mouth every 4 (four) hours as needed for pain. 6 tablet 0  . IBUPROFEN 200 MG PO TABS Oral Take 600-800 mg by mouth 3 (three) times daily as needed. For pain     . PREDNISONE 50 MG PO TABS Oral Take 1 tablet (50 mg total) by mouth daily. 5 tablet 0  . RITONAVIR 100 MG PO CAPS Oral Take 100 mg by mouth 2 (two) times daily.  BP 134/110  Pulse 88  Temp 97.7 F (36.5 C) (Oral)  Resp 20  Ht 5\' 5"  (1.651 m)  Wt 150 lb (68.04 kg)  BMI 24.96 kg/m2  SpO2 96%  LMP 01/27/2012  Physical Exam  Nursing note and vitals reviewed. Constitutional: She is oriented to person, place, and time. She appears well-developed and well-nourished.  Non-toxic appearance. She does not have a sickly appearance.  HENT:  Head: Normocephalic and atraumatic.  Eyes: Conjunctivae, EOM and lids are normal. Pupils are equal, round, and reactive to light. No scleral icterus.  Neck: Trachea normal and normal range of motion. Neck supple.  Cardiovascular: Normal rate, regular rhythm and normal heart sounds.   Pulmonary/Chest: Effort normal and breath sounds normal. No respiratory distress. She has no wheezes. She has no rales.    Abdominal: Soft. Normal appearance. There is no tenderness. There is no rebound, no guarding and no CVA tenderness.  Musculoskeletal:       On initial exam patient will not let me perform passive range of motion of her left shoulder.  Notably she is no tenderness to palpation over her shoulder.  She can flex and extend her elbow without difficulty.  There is no focal tenderness when I palpate her forearm wrist or hand.  She has a palpable radial pulse.  Capillary refill less than 2 seconds in her fingertips.  There is no focal bruising, swelling or deformities noted.  Neurological: She is alert and oriented to person, place, and time. She has normal strength.  Skin: Skin is warm, dry and intact. No rash noted.  Psychiatric: She has a normal mood and affect. Judgment and thought content normal.    ED Course  Procedures (including critical care time)  Labs Reviewed - No data to display Dg Shoulder Left  03/04/2012  *RADIOLOGY REPORT*  Clinical Data: Left shoulder pain.  LEFT SHOULDER - 2+ VIEW  Comparison: None.  Findings: There is no evidence of fracture or dislocation.  The left humeral head is seated within the glenoid fossa.  The acromioclavicular joint is unremarkable in appearance.  No significant soft tissue abnormalities are seen.  The left lung appears clear.  IMPRESSION: No evidence of fracture or dislocation.  Original Report Authenticated By: Tonia Ghent, M.D.     1. Upper extremity pain       MDM  Patient with unclear etiology for her left arm pain.  Patient initially would not move her left shoulder for me on my initial exam.  This led me to obtain a left shoulder x-ray which is normal.  I went back to reexamine her patient would now perform almost full range of motion with her left shoulder.  She complains about pain in her left wrist over the tendons on the anterior aspect.  She complains of her arm freezing up so that she dropped a pan.  This sounds more like tendinitis.   The patient's initial story that the pain began in the shoulder and goes down her arm sounded more like a possible shoulder injury or possible radiculopathy.  Although notably patient had no increase in pain with axial loading.  Patient has no signs of obvious neurovascular deficit.  Patient is noted to move the arm and has good cap refill.  Of note patient has been seen 3 other times in this emergency department this month for various conditions such as tooth pain requesting narcotics.  Patient has had other prior visits in prior months with similar complaints.  At this point  in time concerning this may be a tendinitis or possible radiculopathy in going to place the patient on prednisone and give her a small amount of Valium and Norco and I've advised her to followup with her primary care physician on Monday for reevaluation.  Patient does not appear to have other acute illness that needs further evaluation at this time.        Nat Christen, MD 03/04/12 2952  Nat Christen, MD 03/04/12 [phone]

## 2012-03-04 NOTE — ED Notes (Signed)
Pt reports "horseplaying" with her children 5 days ago and felt a "pop" and "searing" pain running down her left arm to her left hand. States that she has taken OTC meds with no relief. States that arm becomes numb and cold at times. Pt able to move extremity with some difficulty. Able to wiggle all fingers. Arm and hand are warm and pink. Good capillary refill and radial pulse. Pt tearful and anxious. Mother states pt was holding a pan last night and dropped it because pt lost feeling in left hand.

## 2012-03-04 NOTE — ED Notes (Signed)
Triaged by D.Baron Hamper, RN

## 2012-03-04 NOTE — Discharge Instructions (Signed)
Please followup with your primary care doctor on Monday for reevaluation and further pain medications if they are required.  Tendinitis Tendinitis is swelling and inflammation of the tendons. Tendons are band-like tissues that connect muscle to bone. Tendinitis commonly occurs in the:   Shoulders (rotator cuff).   Heels (Achilles tendon).   Elbows (triceps tendon).  CAUSES Tendinitis is usually caused by overusing the tendon, muscles, and joints involved. When the tissue surrounding a tendon (synovium) becomes inflamed, it is called tenosynovitis. Tendinitis commonly develops in people whose jobs require repetitive motions. SYMPTOMS  Pain.   Tenderness.   Mild swelling.  DIAGNOSIS Tendinitis is usually diagnosed by physical exam. Your caregiver may also order X-rays or other imaging tests. TREATMENT Your caregiver may recommend certain medicines or exercises for your treatment. HOME CARE INSTRUCTIONS   Use a sling or splint for as long as directed by your caregiver until the pain decreases.   Put ice on the injured area.   Put ice in a plastic bag.   Place a towel between your skin and the bag.   Leave the ice on for 15 to 20 minutes, 3 to 4 times a day.   Avoid using the limb while the tendon is painful. Perform gentle range of motion exercises only as directed by your caregiver. Stop exercises if pain or discomfort increase, unless directed otherwise by your caregiver.   Only take over-the-counter or prescription medicines for pain, discomfort, or fever as directed by your caregiver.  SEEK MEDICAL CARE IF:   Your pain and swelling increase.   You develop new, unexplained symptoms, especially increased numbness in the hands.  MAKE SURE YOU:   Understand these instructions.   Will watch your condition.   Will get help right away if you are not doing well or get worse.  Document Released: 09/02/2000 Document Revised: 08/25/2011 Document Reviewed: 11/22/2010 [facility]  Patient Information 2012 [place], [place].

## 2012-03-04 NOTE — ED Notes (Signed)
Pt has called department several times regarding prescriptions.  Pt states she did not receive her prescriptions and was told that they would be faxed into the [facility].  Spoke to pharmacist at [facility] in [facility] and they did not receive the prescriptions.  Pt cursing and angry that she did not have her prescriptions.  Chart states prescriptions were printed but also shows that they were sent to the [facility] in [place], [phone].  Spoke to Dr. Karma Ganja, No prescriptions were noted under drug database.  Dr. Karma Ganja agreed to re-write the [facility] prescription and other two will be called into the pharmacy.

## 2012-03-04 NOTE — ED Notes (Signed)
Pt here to pick up prescription.  Explained to patient that in the future, she will have to make sure she has her prescriptions before she leaves.  Pt apologetic, states that she was told she would have them faxed to [facility].  Called in Valium and prednisone to [facility] in [place], [phone] per Dr. Karma Ganja, same prescriptions as listed by Dr. Golda Acre.

## 2012-03-11 ENCOUNTER — Encounter (HOSPITAL_BASED_OUTPATIENT_CLINIC_OR_DEPARTMENT_OTHER): Payer: Self-pay | Admitting: *Deleted

## 2012-03-11 ENCOUNTER — Emergency Department (HOSPITAL_BASED_OUTPATIENT_CLINIC_OR_DEPARTMENT_OTHER)
Admission: EM | Admit: 2012-03-11 | Discharge: 2012-03-11 | Disposition: A | Discharge status: Home / Self Care | Payer: Medicaid Other | Attending: Emergency Medicine | Admitting: Emergency Medicine

## 2012-03-11 DIAGNOSIS — M25519 Pain in unspecified shoulder: Secondary | ICD-10-CM

## 2012-03-11 DIAGNOSIS — Z880 Allergy status to penicillin: Secondary | ICD-10-CM | POA: Insufficient documentation

## 2012-03-11 DIAGNOSIS — Z9089 Acquired absence of other organs: Secondary | ICD-10-CM | POA: Insufficient documentation

## 2012-03-11 DIAGNOSIS — Z87442 Personal history of urinary calculi: Secondary | ICD-10-CM | POA: Insufficient documentation

## 2012-03-11 DIAGNOSIS — Z21 Asymptomatic human immunodeficiency virus [HIV] infection status: Secondary | ICD-10-CM | POA: Insufficient documentation

## 2012-03-11 MED ORDER — HYDROCODONE-ACETAMINOPHEN 5-325 MG PO TABS
2.0000 | ORAL_TABLET | Freq: Once | ORAL | Status: AC
  Administered 2012-03-11: 2 via ORAL
  Filled 2012-03-11: qty 2

## 2012-03-11 MED ORDER — HYDROCODONE-ACETAMINOPHEN 5-325 MG PO TABS: 2.0000 | ORAL_TABLET | ORAL | Status: AC | PRN

## 2012-03-11 NOTE — ED Provider Notes (Signed)
Medical screening examination/treatment/procedure(s) were performed by non-physician practitioner and as supervising physician I was immediately available for consultation/collaboration.   Nataline Basara, MD 03/11/12 2308 

## 2012-03-11 NOTE — ED Notes (Signed)
Pt seen here last weekend for left arm pain and was given Rx. Now states she fell off the porch and her left wrist is hurting. "Even before that, the medicine was not helping. I just don't know what to do."

## 2012-03-11 NOTE — ED Provider Notes (Signed)
History     CSN: [id number]  Arrival date & time 03/11/12  2006   First MD Initiated Contact with Patient 03/11/12 2057      Chief Complaint  Patient presents with  . Wrist Pain    (Consider location/radiation/quality/duration/timing/severity/associated sxs/prior treatment) Patient is a 26 y.o. female presenting with wrist pain. The history is provided by the patient.  Wrist Pain This is a recurrent problem. The current episode started more than 1 month ago. The problem occurs constantly. The problem has been gradually worsening. Associated symptoms include joint swelling and myalgias. Nothing aggravates the symptoms. She has tried nothing for the symptoms. The treatment provided no relief.  Pt seen here recently for shoulder pain.   Pt reports continued pain.  No relief with medications.  (Pt is here frequently for pain related problems)    Past Medical History  Diagnosis Date  . HIV (human immunodeficiency virus infection)   . TMJ syndrome   . Simple seizure     r/t taking tramadol  . Ovarian cyst   . Kidney stone     Past Surgical History  Procedure Date  . Appendectomy   . Cesarean section   . Ankle surgery   . Tubal ligation   . Fracture surgery     History reviewed. No pertinent family history.  History  Substance Use Topics  . Smoking status: Passive Smoker -- 0.5 packs/day  . Smokeless tobacco: Not on file  . Alcohol Use: No    OB History    Grav Para Term Preterm Abortions TAB SAB Ect Mult Living                  Review of Systems  Musculoskeletal: Positive for myalgias and joint swelling.  All other systems reviewed and are negative.    Allergies  Penicillins; Naproxen; and Tramadol  Home Medications   Current Outpatient Rx  Name Route Sig Dispense Refill  . EMTRICITABINE-TENOFOVIR 200-300 MG PO TABS Oral Take 1 tablet by mouth daily.     Marland Kitchen FOSAMPRENAVIR CALCIUM 700 MG PO TABS Oral Take 1,400 mg by mouth 2 (two) times daily.     Marland Kitchen  RITONAVIR 100 MG PO CAPS Oral Take 100 mg by mouth 2 (two) times daily.     Marland Kitchen HYDROCODONE-ACETAMINOPHEN 5-325 MG PO TABS Oral Take 2 tablets by mouth every 4 (four) hours as needed for pain. 10 tablet 0    BP 134/86  Pulse 77  Temp 97.7 F (36.5 C) (Oral)  Resp 18  Ht 5\' 5"  (1.651 m)  Wt 150 lb (68.04 kg)  BMI 24.96 kg/m2  SpO2 100%  LMP 03/11/2012  Physical Exam  Nursing note and vitals reviewed. Constitutional: She is oriented to person, place, and time. She appears well-developed and well-nourished.  Neck: Normal range of motion.  Musculoskeletal: She exhibits tenderness.       Tender AC joint.    Neurological: She is alert and oriented to person, place, and time. She has normal reflexes.  Skin: Skin is warm.  Psychiatric: She has a normal mood and affect.    ED Course  Procedures (including critical care time)  Labs Reviewed - No data to display No results found.   1. Shoulder pain       MDM    I advised pt that I think she needs to see an orthopaedist if she is truly having this much continued pain.   Pt is known to me and is here frequently for pain  complaints.   I will given rx for 10 hydrocodone.  Pt referred to Orthopaedist for evaluation.        Lonia Skinner [place], [place] 03/11/12 2207

## 2012-03-11 NOTE — Discharge Instructions (Signed)
Shoulder Pain The shoulder is a ball and socket joint. The muscles and tendons (rotator cuff) are what keep the shoulder in its joint and stable. This collection of muscles and tendons holds in the head (ball) of the humerus (upper arm bone) in the fossa (cup) of the scapula (shoulder blade). Today no reason was found for your shoulder pain. Often pain in the shoulder may be treated conservatively with temporary immobilization. For example, holding the shoulder in one place using a sling for rest. Physical therapy may be needed if problems continue. HOME CARE INSTRUCTIONS   Apply ice to the sore area for 15 to 20 minutes, 3 to 4 times per day for the first 2 days. Put the ice in a plastic bag. Place a towel between the bag of ice and your skin.   If you have or were given a shoulder sling and straps, do not remove for as long as directed by your caregiver or until you see a caregiver for a follow-up examination. If you need to remove it to shower or bathe, move your arm as little as possible.   Sleep on several pillows at night to lessen swelling and pain.   Only take over-the-counter or prescription medicines for pain, discomfort, or fever as directed by your caregiver.   Keep any follow-up appointments in order to avoid any type of permanent shoulder disability or chronic pain problems.  SEEK MEDICAL CARE IF:   Pain in your shoulder increases or new pain develops in your arm, hand, or fingers.   Your hand or fingers are colder than your other hand.   You do not obtain pain relief with the medications or your pain becomes worse.  SEEK IMMEDIATE MEDICAL CARE IF:   Your arm, hand, or fingers are numb or tingling.   Your arm, hand, or fingers are swollen, painful, or turn white or blue.   You develop chest pain or shortness of breath.  MAKE SURE YOU:   Understand these instructions.   Will watch your condition.   Will get help right away if you are not doing well or get worse.    Document Released: 06/15/2005 Document Revised: 08/25/2011 Document Reviewed: 08/20/2011 [employer] Patient Information 2012 [employer]. 

## 2012-03-15 ENCOUNTER — Encounter (HOSPITAL_BASED_OUTPATIENT_CLINIC_OR_DEPARTMENT_OTHER): Payer: Self-pay | Admitting: Emergency Medicine

## 2012-03-15 DIAGNOSIS — Z21 Asymptomatic human immunodeficiency virus [HIV] infection status: Secondary | ICD-10-CM | POA: Insufficient documentation

## 2012-03-15 DIAGNOSIS — Z880 Allergy status to penicillin: Secondary | ICD-10-CM | POA: Insufficient documentation

## 2012-03-15 DIAGNOSIS — Z87828 Personal history of other (healed) physical injury and trauma: Secondary | ICD-10-CM | POA: Insufficient documentation

## 2012-03-15 DIAGNOSIS — Z886 Allergy status to analgesic agent status: Secondary | ICD-10-CM | POA: Insufficient documentation

## 2012-03-15 DIAGNOSIS — M25519 Pain in unspecified shoulder: Secondary | ICD-10-CM | POA: Insufficient documentation

## 2012-03-15 DIAGNOSIS — F17200 Nicotine dependence, unspecified, uncomplicated: Secondary | ICD-10-CM | POA: Insufficient documentation

## 2012-03-15 DIAGNOSIS — R569 Unspecified convulsions: Secondary | ICD-10-CM | POA: Insufficient documentation

## 2012-03-15 DIAGNOSIS — Z79899 Other long term (current) drug therapy: Secondary | ICD-10-CM | POA: Insufficient documentation
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
## 2012-03-16 ENCOUNTER — Emergency Department (HOSPITAL_BASED_OUTPATIENT_CLINIC_OR_DEPARTMENT_OTHER)
Admission: EM | Admit: 2012-03-16 | Discharge: 2012-03-16 | Disposition: A | Discharge status: Home / Self Care | Payer: Medicaid Other | Attending: Emergency Medicine | Admitting: Emergency Medicine

## 2012-03-16 DIAGNOSIS — M25512 Pain in left shoulder: Secondary | ICD-10-CM

## 2012-03-16 MED ORDER — HYDROCODONE-ACETAMINOPHEN 5-325 MG PO TABS
1.0000 | ORAL_TABLET | Freq: Once | ORAL | Status: AC
  Administered 2012-03-16: 1 via ORAL
  Filled 2012-03-16: qty 1

## 2012-03-16 MED ORDER — HYDROCODONE-ACETAMINOPHEN 5-325 MG PO TABS: 1.0000 | ORAL_TABLET | Freq: Four times a day (QID) | ORAL | Status: AC | PRN

## 2012-03-16 NOTE — ED Provider Notes (Addendum)
History     CSN: [id number]  Arrival date & time 03/15/12  2246   None     Chief Complaint  Patient presents with  . Shoulder Pain    (Consider location/radiation/quality/duration/timing/severity/associated sxs/prior treatment) HPI This is a 26 year old white female who was seen on the 23rd of this month for left shoulder injury. She was given 10 hydrocodone tablets. She returns with pain in her left shoulder which she states is severe and not relieved by ibuprofen. The pain is located in the proximal biceps tendon, a sharp, and radiates down her arm. It is worse with movement or palpation. She is having difficulty abducting her arm at the left shoulder due to pain.  Past Medical History  Diagnosis Date  . HIV (human immunodeficiency virus infection)   . TMJ syndrome   . Simple seizure     r/t taking tramadol  . Ovarian cyst   . Kidney stone     Past Surgical History  Procedure Date  . Appendectomy   . Cesarean section   . Ankle surgery   . Tubal ligation   . Fracture surgery     History reviewed. No pertinent family history.  History  Substance Use Topics  . Smoking status: Passive Smoker -- 0.5 packs/day  . Smokeless tobacco: Not on file  . Alcohol Use: No    OB History    Grav Para Term Preterm Abortions TAB SAB Ect Mult Living                  Review of Systems  All other systems reviewed and are negative.    Allergies  Penicillins; Naproxen; and Tramadol  Home Medications   Current Outpatient Rx  Name Route Sig Dispense Refill  . EMTRICITABINE-TENOFOVIR 200-300 MG PO TABS Oral Take 1 tablet by mouth daily.     Marland Kitchen FOSAMPRENAVIR CALCIUM 700 MG PO TABS Oral Take 1,400 mg by mouth 2 (two) times daily.     Marland Kitchen RITONAVIR 100 MG PO CAPS Oral Take 100 mg by mouth 2 (two) times daily.     Marland Kitchen HYDROCODONE-ACETAMINOPHEN 5-325 MG PO TABS Oral Take 2 tablets by mouth every 4 (four) hours as needed for pain. 10 tablet 0    BP 129/82  Pulse 85  Temp 98.6 F  (37 C) (Oral)  Resp 20  SpO2 100%  LMP 03/11/2012  Physical Exam General: Well-developed, well-nourished female in no acute distress; appearance consistent with age of record HENT: normocephalic, atraumatic Eyes: pupils equal round and reactive to light; extraocular muscles intact Neck: supple Heart: regular rate and rhythm Lungs: Normal respiratory effort and excursion Abdomen: soft; nondistended Extremities: No deformity; tenderness over proximal left biceps tendon, pain on range of motion of left shoulder with limited range of motion due to pain, left upper extremity distally neurovascularly intact Neurologic: Awake, alert and oriented; motor function intact in all extremities and symmetric; no facial droop Skin: Warm and dry Psychiatric: Normal mood and affect    ED Course  Procedures (including critical care time)     MDM  Patient has followup with Dr. Magnus Ivan on Wednesday of next week, 5 days from today.        Hanley Seamen, MD 03/16/12 7829  Hanley Seamen, MD 03/16/12 5621

## 2012-03-29 ENCOUNTER — Encounter (HOSPITAL_BASED_OUTPATIENT_CLINIC_OR_DEPARTMENT_OTHER): Payer: Self-pay | Admitting: *Deleted

## 2012-03-29 DIAGNOSIS — Z21 Asymptomatic human immunodeficiency virus [HIV] infection status: Secondary | ICD-10-CM | POA: Insufficient documentation

## 2012-03-29 DIAGNOSIS — Z87442 Personal history of urinary calculi: Secondary | ICD-10-CM | POA: Insufficient documentation

## 2012-03-29 DIAGNOSIS — Z9089 Acquired absence of other organs: Secondary | ICD-10-CM | POA: Insufficient documentation

## 2012-03-29 DIAGNOSIS — K089 Disorder of teeth and supporting structures, unspecified: Secondary | ICD-10-CM | POA: Insufficient documentation

## 2012-03-29 DIAGNOSIS — Z880 Allergy status to penicillin: Secondary | ICD-10-CM | POA: Insufficient documentation

## 2012-03-29 NOTE — ED Notes (Signed)
Pt had a left upper tooth pulled three days ago. Pt reports pain that began this evening. Pt concerned for dry socket. Denies fevers. Pt is currently on Keflex 500mg  PO

## 2012-03-30 ENCOUNTER — Emergency Department (HOSPITAL_BASED_OUTPATIENT_CLINIC_OR_DEPARTMENT_OTHER)
Admission: EM | Admit: 2012-03-30 | Discharge: 2012-03-30 | Disposition: A | Discharge status: Home / Self Care | Payer: Medicaid Other | Attending: Emergency Medicine | Admitting: Emergency Medicine

## 2012-03-30 DIAGNOSIS — K0889 Other specified disorders of teeth and supporting structures: Secondary | ICD-10-CM

## 2012-03-30 NOTE — ED Provider Notes (Signed)
History     CSN: [id number]  Arrival date & time 03/29/12  2251   First MD Initiated Contact with Patient 03/30/12 0112      Chief Complaint  Patient presents with  . Dental Pain    (Consider location/radiation/quality/duration/timing/severity/associated sxs/prior treatment) HPI Pt with long history of chronic dental pain, including multiple ED visits for same reports she had a L upper tooth pulled about 3 days ago. She has been taking Keflex, but was not given any pain medications. She reports increased pain since earlier today, radiating into her L cheek. No drainage, no fever.   Past Medical History  Diagnosis Date  . HIV (human immunodeficiency virus infection)   . TMJ syndrome   . Simple seizure     r/t taking tramadol  . Ovarian cyst   . Kidney stone     Past Surgical History  Procedure Date  . Appendectomy   . Cesarean section   . Ankle surgery   . Tubal ligation   . Fracture surgery     No family history on file.  History  Substance Use Topics  . Smoking status: Passive Smoker -- 0.5 packs/day  . Smokeless tobacco: Not on file  . Alcohol Use: No    OB History    Grav Para Term Preterm Abortions TAB SAB Ect Mult Living                  Review of Systems All other systems reviewed and are negative except as noted in HPI.   Allergies  Penicillins; Naproxen; and Tramadol  Home Medications   Current Outpatient Rx  Name Route Sig Dispense Refill  . CEPHALEXIN 500 MG PO CAPS Oral Take 500 mg by mouth 2 (two) times daily.    Marland Kitchen EMTRICITABINE-TENOFOVIR 200-300 MG PO TABS Oral Take 1 tablet by mouth 2 (two) times daily.    Marland Kitchen FOSAMPRENAVIR CALCIUM 700 MG PO TABS Oral Take 1,400 mg by mouth 2 (two) times daily.    . IBUPROFEN 200 MG PO TABS Oral Take 600-800 mg by mouth once as needed. For headache    . RITONAVIR 100 MG PO CAPS Oral Take 100 mg by mouth 2 (two) times daily.      BP 125/84  Pulse 68  Temp 98.3 F (36.8 C) (Oral)  Resp 18  Ht 5\' 5"   (1.651 m)  Wt 150 lb (68.04 kg)  BMI 24.96 kg/m2  SpO2 99%  LMP 03/29/2012  Physical Exam  Constitutional: She is oriented to person, place, and time. She appears well-developed and well-nourished.  HENT:  Head: Normocephalic and atraumatic.       Healing socket in L upper without signs of infection or dry socket.  Neck: Neck supple.  Pulmonary/Chest: Effort normal.  Neurological: She is alert and oriented to person, place, and time. No cranial nerve deficit.  Psychiatric: She has a normal mood and affect. Her behavior is normal.    ED Course  Procedures (including critical care time)  Labs Reviewed - No data to display No results found.   No diagnosis found.    MDM  Pt with post-extraction dental pain. She was advised to followup with her dentist for pain control. Offered pain medication in the ED but reports she drove to the ED tonight.         Charles B. Bernette Mayers, MD 03/30/12 0130

## 2012-05-07 ENCOUNTER — Emergency Department (HOSPITAL_BASED_OUTPATIENT_CLINIC_OR_DEPARTMENT_OTHER)
Admission: EM | Admit: 2012-05-07 | Discharge: 2012-05-07 | Disposition: A | Discharge status: Home / Self Care | Payer: Medicaid Other | Attending: Emergency Medicine | Admitting: Emergency Medicine

## 2012-05-07 ENCOUNTER — Encounter (HOSPITAL_BASED_OUTPATIENT_CLINIC_OR_DEPARTMENT_OTHER): Payer: Self-pay | Admitting: Family Medicine

## 2012-05-07 ENCOUNTER — Emergency Department (HOSPITAL_BASED_OUTPATIENT_CLINIC_OR_DEPARTMENT_OTHER): Payer: Medicaid Other

## 2012-05-07 DIAGNOSIS — X58XXXA Exposure to other specified factors, initial encounter: Secondary | ICD-10-CM | POA: Insufficient documentation

## 2012-05-07 DIAGNOSIS — T148XXA Other injury of unspecified body region, initial encounter: Secondary | ICD-10-CM

## 2012-05-07 DIAGNOSIS — IMO0002 Reserved for concepts with insufficient information to code with codable children: Secondary | ICD-10-CM | POA: Insufficient documentation

## 2012-05-07 DIAGNOSIS — F17200 Nicotine dependence, unspecified, uncomplicated: Secondary | ICD-10-CM | POA: Insufficient documentation

## 2012-05-07 DIAGNOSIS — R569 Unspecified convulsions: Secondary | ICD-10-CM | POA: Insufficient documentation

## 2012-05-07 DIAGNOSIS — Z87442 Personal history of urinary calculi: Secondary | ICD-10-CM | POA: Insufficient documentation

## 2012-05-07 DIAGNOSIS — Z880 Allergy status to penicillin: Secondary | ICD-10-CM | POA: Insufficient documentation

## 2012-05-07 DIAGNOSIS — Z888 Allergy status to other drugs, medicaments and biological substances status: Secondary | ICD-10-CM | POA: Insufficient documentation

## 2012-05-07 DIAGNOSIS — Z21 Asymptomatic human immunodeficiency virus [HIV] infection status: Secondary | ICD-10-CM | POA: Insufficient documentation

## 2012-05-07 HISTORY — DX: Anxiety disorder, unspecified: F41.9

## 2012-05-07 NOTE — ED Provider Notes (Signed)
History     CSN: [id number]  Arrival date & time 05/07/12  1004   First MD Initiated Contact with Patient 05/07/12 1017      Chief Complaint  Patient presents with  . Foot Injury    (Consider location/radiation/quality/duration/timing/severity/associated sxs/prior treatment) HPI Abrasion left foot with increased tenderness.  States ibuprofen not working and pain worse since yesterday when she noted green discharge.  No redness or swelling, fever or chills.  Past Medical History  Diagnosis Date  . HIV (human immunodeficiency virus infection)   . TMJ syndrome   . Simple seizure     r/t taking tramadol  . Ovarian cyst   . Kidney stone     Past Surgical History  Procedure Date  . Appendectomy   . Cesarean section   . Ankle surgery   . Tubal ligation   . Fracture surgery     No family history on file.  History  Substance Use Topics  . Smoking status: Current Everyday Smoker -- 0.5 packs/day  . Smokeless tobacco: Not on file  . Alcohol Use: No    OB History    Grav Para Term Preterm Abortions TAB SAB Ect Mult Living                  Review of Systems  All other systems reviewed and are negative.    Allergies  Penicillins; Naproxen; and Tramadol  Home Medications   Current Outpatient Rx  Name Route Sig Dispense Refill  . CEPHALEXIN 500 MG PO CAPS Oral Take 500 mg by mouth 2 (two) times daily.    Marland Kitchen EMTRICITABINE-TENOFOVIR 200-300 MG PO TABS Oral Take 1 tablet by mouth 2 (two) times daily.    Marland Kitchen FOSAMPRENAVIR CALCIUM 700 MG PO TABS Oral Take 1,400 mg by mouth 2 (two) times daily.    . IBUPROFEN 200 MG PO TABS Oral Take 600-800 mg by mouth once as needed. For headache    . RITONAVIR 100 MG PO CAPS Oral Take 100 mg by mouth 2 (two) times daily.      BP 110/57  Pulse 79  Temp 98 F (36.7 C) (Oral)  Resp 14  Ht 5\' 5"  (1.651 m)  Wt 150 lb (68.04 kg)  BMI 24.96 kg/m2  SpO2 100%  LMP 04/16/2012  Physical Exam  Nursing note and vitals  reviewed. Constitutional: She is oriented to person, place, and time. She appears well-developed and well-nourished.  Musculoskeletal:       Abrasion left foot medial aspect left mtp joint with minimal erythema at edges, wound base 1 cm 2 without discharge or fluctuance, diffuse tenderness of foot. Dp 2+, sensation intact, proximal foot with some diffuse tenderness but no erythema or swelling.   Neurological: She is alert and oriented to person, place, and time. She has normal reflexes.  Skin: Skin is warm and dry.  Psychiatric: She has a normal mood and affect. Her behavior is normal. Judgment and thought content normal.    ED Course  Procedures (including critical care time)  Labs Reviewed - No data to display No results found.   No diagnosis found.    MDM  No sign of infection no bony abnormality,  Tetanus up to date.  Patient advised regarding wound care.        Hilario Quarry, MD 05/07/12 [phone]

## 2012-05-07 NOTE — ED Notes (Signed)
Pt sts she scraped foot on outdoor concrete pool about a week ago and has been putting bacitracin on wound since. Pt sts ibuprofen is not helping the pain.

## 2012-05-28 ENCOUNTER — Emergency Department (HOSPITAL_BASED_OUTPATIENT_CLINIC_OR_DEPARTMENT_OTHER)
Admission: EM | Admit: 2012-05-28 | Discharge: 2012-05-28 | Disposition: A | Discharge status: Home / Self Care | Payer: Medicaid Other | Attending: Emergency Medicine | Admitting: Emergency Medicine

## 2012-05-28 ENCOUNTER — Encounter (HOSPITAL_BASED_OUTPATIENT_CLINIC_OR_DEPARTMENT_OTHER): Payer: Self-pay | Admitting: Emergency Medicine

## 2012-05-28 ENCOUNTER — Emergency Department (HOSPITAL_BASED_OUTPATIENT_CLINIC_OR_DEPARTMENT_OTHER): Payer: Medicaid Other

## 2012-05-28 DIAGNOSIS — R319 Hematuria, unspecified: Secondary | ICD-10-CM | POA: Insufficient documentation

## 2012-05-28 DIAGNOSIS — F17200 Nicotine dependence, unspecified, uncomplicated: Secondary | ICD-10-CM | POA: Insufficient documentation

## 2012-05-28 DIAGNOSIS — Z9089 Acquired absence of other organs: Secondary | ICD-10-CM | POA: Insufficient documentation

## 2012-05-28 DIAGNOSIS — F411 Generalized anxiety disorder: Secondary | ICD-10-CM | POA: Insufficient documentation

## 2012-05-28 DIAGNOSIS — Z79899 Other long term (current) drug therapy: Secondary | ICD-10-CM | POA: Insufficient documentation

## 2012-05-28 DIAGNOSIS — R109 Unspecified abdominal pain: Secondary | ICD-10-CM | POA: Insufficient documentation

## 2012-05-28 DIAGNOSIS — Z21 Asymptomatic human immunodeficiency virus [HIV] infection status: Secondary | ICD-10-CM | POA: Insufficient documentation

## 2012-05-28 LAB — URINALYSIS, ROUTINE W REFLEX MICROSCOPIC
Bilirubin Urine: NEGATIVE
Glucose, UA: NEGATIVE mg/dL
Nitrite: NEGATIVE
Specific Gravity, Urine: 1.016 (ref 1.005–1.030)
pH: 7 (ref 5.0–8.0)

## 2012-05-28 LAB — URINE MICROSCOPIC-ADD ON

## 2012-05-28 MED ORDER — ONDANSETRON HCL 4 MG/2ML IJ SOLN
4.0000 mg | Freq: Once | INTRAMUSCULAR | Status: AC
  Administered 2012-05-28: 4 mg via INTRAVENOUS
  Filled 2012-05-28 (×2): qty 2

## 2012-05-28 MED ORDER — SODIUM CHLORIDE 0.9 % IV SOLN: Freq: Once | INTRAVENOUS | Status: DC

## 2012-05-28 MED ORDER — POLYETHYLENE GLYCOL 3350 17 G PO PACK: 17.0000 g | PACK | Freq: Every day | ORAL | Status: AC

## 2012-05-28 MED ORDER — NITROFURANTOIN MONOHYD MACRO 100 MG PO CAPS: 100.0000 mg | ORAL_CAPSULE | Freq: Two times a day (BID) | ORAL | Status: AC

## 2012-05-28 MED ORDER — HYDROCODONE-ACETAMINOPHEN 5-325 MG PO TABS: 2.0000 | ORAL_TABLET | ORAL | Status: AC | PRN

## 2012-05-28 MED ORDER — HYDROMORPHONE HCL PF 1 MG/ML IJ SOLN
1.0000 mg | Freq: Once | INTRAMUSCULAR | Status: AC
  Administered 2012-05-28: 1 mg via INTRAVENOUS
  Filled 2012-05-28 (×2): qty 1

## 2012-05-28 NOTE — ED Notes (Signed)
Pt c/o of left sided flank pain that woke her up this morning. Pt has a history of kidney stones.

## 2012-05-28 NOTE — ED Notes (Signed)
Wants pain medicine

## 2012-05-28 NOTE — ED Notes (Signed)
Patient transported to CT 

## 2012-05-28 NOTE — ED Notes (Signed)
Pt states she began having left flank pain with nausea also reports blood in her urine

## 2012-05-28 NOTE — ED Provider Notes (Signed)
History     CSN: [id number]  Arrival date & time 05/28/12  1137   First MD Initiated Contact with Patient 05/28/12 1230      Chief Complaint  Patient presents with  . Flank Pain    (Consider location/radiation/quality/duration/timing/severity/associated sxs/prior treatment) Patient is a 26 y.o. female presenting with flank pain. The history is provided by the patient. No language interpreter was used.  Flank Pain This is a new problem. The current episode started today. The problem occurs constantly. The problem has been gradually worsening. Associated symptoms include abdominal pain. Nothing aggravates the symptoms. She has tried nothing for the symptoms. The treatment provided moderate relief.  Pt complains of nausea and blood in her urine.  Pt complains of back pain  Past Medical History  Diagnosis Date  . HIV (human immunodeficiency virus infection)   . TMJ syndrome   . Simple seizure     r/t taking tramadol  . Ovarian cyst   . Kidney stone   . Anxiety     Past Surgical History  Procedure Date  . Appendectomy   . Cesarean section   . Ankle surgery   . Tubal ligation   . Fracture surgery     No family history on file.  History  Substance Use Topics  . Smoking status: Current Everyday Smoker -- 0.5 packs/day  . Smokeless tobacco: Not on file  . Alcohol Use: No    OB History    Grav Para Term Preterm Abortions TAB SAB Ect Mult Living                  Review of Systems  Gastrointestinal: Positive for abdominal pain.  Genitourinary: Positive for flank pain.  Musculoskeletal: Positive for back pain.  All other systems reviewed and are negative.    Allergies  Penicillins; Naproxen; and Tramadol  Home Medications   Current Outpatient Rx  Name Route Sig Dispense Refill  . ALPRAZOLAM 0.5 MG PO TABS Oral Take 0.5 mg by mouth 2 (two) times daily.     Marland Kitchen EMTRICITABINE-TENOFOVIR 200-300 MG PO TABS Oral Take 1 tablet by mouth 2 (two) times daily.    Marland Kitchen  FOSAMPRENAVIR CALCIUM 700 MG PO TABS Oral Take 1,400 mg by mouth 2 (two) times daily.    Marland Kitchen RITONAVIR 100 MG PO CAPS Oral Take 100 mg by mouth 2 (two) times daily.    . CEPHALEXIN 500 MG PO CAPS Oral Take 500 mg by mouth 2 (two) times daily.    . IBUPROFEN 200 MG PO TABS Oral Take 600-800 mg by mouth once as needed. For headache      BP 119/71  Pulse 96  Temp 98.4 F (36.9 C) (Oral)  Resp 18  Ht 5\' 5"  (1.651 m)  Wt 145 lb (65.772 kg)  BMI 24.13 kg/m2  SpO2 96%  LMP 05/25/2012  Physical Exam  Nursing note and vitals reviewed. Constitutional: She is oriented to person, place, and time. She appears well-developed and well-nourished.  HENT:  Head: Normocephalic.  Eyes: Pupils are equal, round, and reactive to light.  Neck: Normal range of motion.  Cardiovascular: Normal rate and normal heart sounds.   Pulmonary/Chest: Effort normal and breath sounds normal.  Abdominal: Soft.  Musculoskeletal: Normal range of motion.       Tender left flank  Neurological: She is alert and oriented to person, place, and time. She has normal reflexes.  Skin: Skin is warm.  Psychiatric: She has a normal mood and affect.  ED Course  Procedures (including critical care time)   Labs Reviewed  PREGNANCY, URINE  URINALYSIS, ROUTINE W REFLEX MICROSCOPIC   No results found.   1. Hematuria       MDM  I will treat with macrobid and miralax for constipation.   Pt advised to follow up with her MD.       Elson Areas, PA 05/28/12 1337  Lonia Skinner [place], [place] 05/28/12 1339

## 2012-05-29 NOTE — ED Provider Notes (Signed)
History/physical exam/procedure(s) were performed by non-physician practitioner and as supervising physician I was immediately available for consultation/collaboration. I have reviewed all notes and am in agreement with care and plan.   Kobee Medlen S Javelle Donigan, MD 05/29/12 1756 

## 2012-05-31 ENCOUNTER — Emergency Department (HOSPITAL_BASED_OUTPATIENT_CLINIC_OR_DEPARTMENT_OTHER)
Admission: EM | Admit: 2012-05-31 | Discharge: 2012-05-31 | Disposition: A | Discharge status: Home / Self Care | Payer: Medicaid Other | Attending: Emergency Medicine | Admitting: Emergency Medicine

## 2012-05-31 ENCOUNTER — Encounter (HOSPITAL_BASED_OUTPATIENT_CLINIC_OR_DEPARTMENT_OTHER): Payer: Self-pay | Admitting: *Deleted

## 2012-05-31 DIAGNOSIS — F17200 Nicotine dependence, unspecified, uncomplicated: Secondary | ICD-10-CM | POA: Insufficient documentation

## 2012-05-31 DIAGNOSIS — Z21 Asymptomatic human immunodeficiency virus [HIV] infection status: Secondary | ICD-10-CM | POA: Insufficient documentation

## 2012-05-31 DIAGNOSIS — F411 Generalized anxiety disorder: Secondary | ICD-10-CM | POA: Insufficient documentation

## 2012-05-31 DIAGNOSIS — Z79899 Other long term (current) drug therapy: Secondary | ICD-10-CM | POA: Insufficient documentation

## 2012-05-31 DIAGNOSIS — R112 Nausea with vomiting, unspecified: Secondary | ICD-10-CM | POA: Insufficient documentation

## 2012-05-31 DIAGNOSIS — Z880 Allergy status to penicillin: Secondary | ICD-10-CM | POA: Insufficient documentation

## 2012-05-31 DIAGNOSIS — N23 Unspecified renal colic: Secondary | ICD-10-CM | POA: Insufficient documentation

## 2012-05-31 LAB — BASIC METABOLIC PANEL
BUN: 14 mg/dL (ref 6–23)
Calcium: 9.7 mg/dL (ref 8.4–10.5)
Creatinine, Ser: 0.8 mg/dL (ref 0.50–1.10)
GFR calc non Af Amer: >90 mL/min (ref >90)
Glucose, Bld: 98 mg/dL (ref 70–99)

## 2012-05-31 LAB — URINE MICROSCOPIC-ADD ON

## 2012-05-31 LAB — PREGNANCY, URINE: Preg Test, Ur: NEGATIVE

## 2012-05-31 LAB — URINALYSIS, ROUTINE W REFLEX MICROSCOPIC
Glucose, UA: NEGATIVE mg/dL
Leukocytes, UA: NEGATIVE
Nitrite: NEGATIVE

## 2012-05-31 MED ORDER — HYDROMORPHONE HCL PF 1 MG/ML IJ SOLN: 1.0000 mg | Freq: Once | INTRAMUSCULAR | Status: DC

## 2012-05-31 MED ORDER — SODIUM CHLORIDE 0.9 % IV SOLN
Freq: Once | INTRAVENOUS | Status: AC
  Administered 2012-05-31: 10:00:00 via INTRAVENOUS

## 2012-05-31 MED ORDER — OXYCODONE-ACETAMINOPHEN 5-325 MG PO TABS: 1.0000 | ORAL_TABLET | Freq: Four times a day (QID) | ORAL | Status: DC | PRN

## 2012-05-31 MED ORDER — ONDANSETRON HCL 4 MG/2ML IJ SOLN
4.0000 mg | Freq: Once | INTRAMUSCULAR | Status: AC
Start: 2012-05-31 — End: 2012-05-31
  Administered 2012-05-31: 4 mg via INTRAVENOUS
  Filled 2012-05-31: qty 2

## 2012-05-31 MED ORDER — ONDANSETRON HCL 4 MG PO TABS: 4.0000 mg | ORAL_TABLET | Freq: Four times a day (QID) | ORAL | Status: AC

## 2012-05-31 MED ORDER — MORPHINE SULFATE 4 MG/ML IJ SOLN
INTRAMUSCULAR | Status: AC
  Administered 2012-05-31: 4 mg
  Filled 2012-05-31: qty 1

## 2012-05-31 MED ORDER — MORPHINE SULFATE 4 MG/ML IJ SOLN
4.0000 mg | Freq: Once | INTRAMUSCULAR | Status: AC
  Administered 2012-05-31: 4 mg via INTRAVENOUS
  Filled 2012-05-31: qty 1

## 2012-05-31 NOTE — ED Notes (Signed)
Patient given PO fluids

## 2012-05-31 NOTE — ED Provider Notes (Signed)
History     CSN: [id number]  Arrival date & time 05/31/12  5784   First MD Initiated Contact with Patient 05/31/12 [phone]      Chief Complaint  Patient presents with  . Flank Pain  . Nausea    (Consider location/radiation/quality/duration/timing/severity/associated sxs/prior treatment) HPI Pt presents with c/o left flank pain.  She was seen in the ED 05/28/12 and CT scan at that time revealed punctate left kidney stone.  She did have hematuria at that time as well.  She was started on antibiotics, given pain meds.  She states pain had improved over the past couple of days, until increased pain with vomiting this morning.  Pain is sharp and constant.  No fever/chills.  Emesis nonbloody and nonbilious.  States she has been taking antibiotics as prescribed.  Pt is HIV+ she states her last CD4 counts 3 months ago were "good".  There are no other associated systemic symptoms, there are no other alleviating or modifying factors.   Past Medical History  Diagnosis Date  . HIV (human immunodeficiency virus infection)   . TMJ syndrome   . Simple seizure     r/t taking tramadol  . Ovarian cyst   . Kidney stone   . Anxiety     Past Surgical History  Procedure Date  . Appendectomy   . Cesarean section   . Ankle surgery   . Tubal ligation   . Fracture surgery     No family history on file.  History  Substance Use Topics  . Smoking status: Current Every Day Smoker -- 0.5 packs/day  . Smokeless tobacco: Not on file  . Alcohol Use: No    OB History    Grav Para Term Preterm Abortions TAB SAB Ect Mult Living                  Review of Systems ROS reviewed and all otherwise negative except for mentioned in HPI  Allergies  Penicillins; Naproxen; and Tramadol  Home Medications   Current Outpatient Rx  Name Route Sig Dispense Refill  . ALPRAZOLAM 0.5 MG PO TABS Oral Take 0.5 mg by mouth 2 (two) times daily.     . CEPHALEXIN 500 MG PO CAPS Oral Take 500 mg by mouth 2 (two) times  daily.    Marland Kitchen EMTRICITABINE-TENOFOVIR 200-300 MG PO TABS Oral Take 1 tablet by mouth 2 (two) times daily.    Marland Kitchen FOSAMPRENAVIR CALCIUM 700 MG PO TABS Oral Take 1,400 mg by mouth 2 (two) times daily.    Marland Kitchen HYDROCODONE-ACETAMINOPHEN 5-325 MG PO TABS Oral Take 2 tablets by mouth every 4 (four) hours as needed for pain. 16 tablet 0  . IBUPROFEN 200 MG PO TABS Oral Take 600-800 mg by mouth once as needed. For headache    . NITROFURANTOIN MONOHYD MACRO 100 MG PO CAPS Oral Take 1 capsule (100 mg total) by mouth 2 (two) times daily. 14 capsule 0  . ONDANSETRON HCL 4 MG PO TABS Oral Take 1 tablet (4 mg total) by mouth every 6 (six) hours. 12 tablet 0  . OXYCODONE-ACETAMINOPHEN 5-325 MG PO TABS Oral Take 1-2 tablets by mouth every 6 (six) hours as needed for pain. 15 tablet 0  . POLYETHYLENE GLYCOL 3350 PO PACK Oral Take 17 g by mouth daily. 14 each 0  . RITONAVIR 100 MG PO CAPS Oral Take 100 mg by mouth 2 (two) times daily.      BP 115/70  Pulse 68  Temp 98 F (36.7  C) (Oral)  Resp 16  Ht 5\' 5"  (1.651 m)  Wt 150 lb (68.04 kg)  BMI 24.96 kg/m2  SpO2 99%  LMP 05/25/2012 Vitals reviewed Physical Exam Physical Examination: General appearance - alert, well appearing, and in no distress Mental status - alert, oriented to person, place, and time Eyes - pupils equal and reactive, no scleral icterus or conjunctival injection Mouth - mucous membranes moist, pharynx normal without lesions Chest - clear to auscultation, no wheezes, rales or rhonchi, symmetric air entry Heart - normal rate, regular rhythm, normal S1, S2, no murmurs, rubs, clicks or gallops Abdomen - soft, nontender, nondistended, no masses or organomegaly, nabs Back exam - full range of motion, mild left sided CVA tenderness, no right sided CVA tenderness Extremities - peripheral pulses normal, no pedal edema, no clubbing or cyanosis Skin - normal coloration and turgor, no rashes, brisk cap refill  ED Course  Procedures (including critical  care time)  Labs Reviewed  URINALYSIS, ROUTINE W REFLEX MICROSCOPIC - Abnormal; Notable for the following:    Hgb urine dipstick MODERATE (*)     All other components within normal limits  URINE MICROSCOPIC-ADD ON - Abnormal; Notable for the following:    Squamous Epithelial / LPF FEW (*)     All other components within normal limits  PREGNANCY, URINE  BASIC METABOLIC PANEL   No results found.   1. Ureteral colic       MDM  Pt presenting with left flank pain- was seen in ED 3 days ago and CT scan showed small left sided ureteral stone.  Suspect her pain is due to this passing/ureteral colic.  No fever/chills, tolerating fluids by mouth in the ED.  Renal function normal.  Pt is taking macrobid as prescribed at last visit- states she is almost out of pain medications.  Will give rx for pain meds and antiemetics.  Referral information given for urology.  Discharged with strict return precautions.  Pt agreeable with plan.        Ethelda Chick, MD 05/31/12 1316

## 2012-05-31 NOTE — ED Notes (Signed)
Patient states she was seen 3 days ago for left flank pain.  States pain has persisted and this morning has gotten worse.

## 2012-05-31 NOTE — ED Notes (Signed)
Pt reports she was seen a few days ago for similar symptoms, given an ABX but not feeling better.  She awakened this morning with nausea/vomiting and increased pain.

## 2012-06-02 LAB — URINE CULTURE

## 2012-06-03 NOTE — ED Notes (Signed)
+   Urine Chart sent to EDP office for review. 

## 2012-06-04 NOTE — ED Notes (Signed)
Chart returned from EDP office. Rx  For Trimethoprim/sulfamethoxazole 160/800 mg. Sig: one tablet po BID #6 no refills per [facility]

## 2012-06-08 ENCOUNTER — Emergency Department (HOSPITAL_BASED_OUTPATIENT_CLINIC_OR_DEPARTMENT_OTHER)
Admission: EM | Admit: 2012-06-08 | Discharge: 2012-06-08 | Disposition: A | Discharge status: Home / Self Care | Payer: Medicaid Other | Attending: Emergency Medicine | Admitting: Emergency Medicine

## 2012-06-08 ENCOUNTER — Telehealth (HOSPITAL_COMMUNITY): Payer: Self-pay | Admitting: *Deleted

## 2012-06-08 ENCOUNTER — Encounter (HOSPITAL_BASED_OUTPATIENT_CLINIC_OR_DEPARTMENT_OTHER): Payer: Self-pay | Admitting: Emergency Medicine

## 2012-06-08 DIAGNOSIS — F17200 Nicotine dependence, unspecified, uncomplicated: Secondary | ICD-10-CM | POA: Insufficient documentation

## 2012-06-08 DIAGNOSIS — Z888 Allergy status to other drugs, medicaments and biological substances status: Secondary | ICD-10-CM | POA: Insufficient documentation

## 2012-06-08 DIAGNOSIS — S025XXA Fracture of tooth (traumatic), initial encounter for closed fracture: Secondary | ICD-10-CM

## 2012-06-08 DIAGNOSIS — X58XXXA Exposure to other specified factors, initial encounter: Secondary | ICD-10-CM | POA: Insufficient documentation

## 2012-06-08 DIAGNOSIS — F411 Generalized anxiety disorder: Secondary | ICD-10-CM | POA: Insufficient documentation

## 2012-06-08 DIAGNOSIS — Z880 Allergy status to penicillin: Secondary | ICD-10-CM | POA: Insufficient documentation

## 2012-06-08 DIAGNOSIS — Z21 Asymptomatic human immunodeficiency virus [HIV] infection status: Secondary | ICD-10-CM | POA: Insufficient documentation

## 2012-06-08 MED ORDER — HYDROCODONE-ACETAMINOPHEN 5-325 MG PO TABS: 2.0000 | ORAL_TABLET | ORAL | Status: AC | PRN

## 2012-06-08 NOTE — ED Provider Notes (Signed)
History     CSN: [id number]  Arrival date & time 06/08/12  8657   First MD Initiated Contact with Patient 06/08/12 2038      Chief Complaint  Patient presents with  . Dental Injury    (Consider location/radiation/quality/duration/timing/severity/associated sxs/prior treatment) Patient is a 26 y.o. female presenting with dental injury. The history is provided by the patient. No language interpreter was used.  Dental Injury This is a new problem. The current episode started today. The problem occurs constantly. The problem has been gradually worsening. Pertinent negatives include no nausea. Exacerbated by: air. She has tried nothing for the symptoms.  Pt bit into a shell from a nut and broke a tooth  Past Medical History  Diagnosis Date  . HIV (human immunodeficiency virus infection)   . TMJ syndrome   . Simple seizure     r/t taking tramadol  . Ovarian cyst   . Kidney stone   . Anxiety     Past Surgical History  Procedure Date  . Appendectomy   . Cesarean section   . Ankle surgery   . Tubal ligation   . Fracture surgery     No family history on file.  History  Substance Use Topics  . Smoking status: Current Every Day Smoker -- 0.5 packs/day  . Smokeless tobacco: Not on file  . Alcohol Use: No    OB History    Grav Para Term Preterm Abortions TAB SAB Ect Mult Living                  Review of Systems  Gastrointestinal: Negative for nausea.  All other systems reviewed and are negative.    Allergies  Penicillins; Naproxen; and Tramadol  Home Medications   Current Outpatient Rx  Name Route Sig Dispense Refill  . ALPRAZOLAM 0.5 MG PO TABS Oral Take 0.5 mg by mouth 2 (two) times daily.     Marland Kitchen EMTRICITABINE-TENOFOVIR 200-300 MG PO TABS Oral Take 1 tablet by mouth 2 (two) times daily. HIV medication.  Consult needed.    Marland Kitchen FOSAMPRENAVIR CALCIUM 700 MG PO TABS Oral Take 1,400 mg by mouth 2 (two) times daily. HIV medication.  Consult needed.    Marland Kitchen RITONAVIR  100 MG PO CAPS Oral Take 100 mg by mouth 2 (two) times daily. HIV medication.  Consult needed.    Marland Kitchen HYDROCODONE-ACETAMINOPHEN 5-325 MG PO TABS Oral Take 2 tablets by mouth every 4 (four) hours as needed for pain. 16 tablet 0  . HYDROCODONE-ACETAMINOPHEN 5-325 MG PO TABS Oral Take 2 tablets by mouth every 4 (four) hours as needed for pain. 16 tablet 0  . NITROFURANTOIN MONOHYD MACRO 100 MG PO CAPS Oral Take 1 capsule (100 mg total) by mouth 2 (two) times daily. 14 capsule 0  . ONDANSETRON HCL 4 MG PO TABS Oral Take 1 tablet (4 mg total) by mouth every 6 (six) hours. 12 tablet 0    BP 122/86  Pulse 82  Temp 98.1 F (36.7 C) (Oral)  Resp 16  Ht 5\' 5"  (1.651 m)  Wt 145 lb (65.772 kg)  BMI 24.13 kg/m2  SpO2 100%  LMP 05/25/2012  Physical Exam  Nursing note and vitals reviewed. Constitutional: She appears well-developed and well-nourished.  HENT:  Head: Normocephalic.  Right Ear: External ear normal.  Left Ear: External ear normal.  Nose: Nose normal.  Mouth/Throat: Oropharynx is clear and moist.       Tooth looks like there may broken area  Eyes: Pupils are  equal, round, and reactive to light.  Skin: Skin is warm.  Psychiatric: She has a normal mood and affect.    ED Course  Procedures (including critical care time)  Labs Reviewed - No data to display No results found.   1. Broken tooth       MDM  Pt advised to call her dentist to be seen        Elson Areas, [place] 06/08/12 2110

## 2012-06-08 NOTE — ED Notes (Signed)
Bit into a nut that still had part of the shell on it and had sudden pain on her upper left tooth in back.

## 2012-06-09 NOTE — ED Provider Notes (Signed)
Medical screening examination/treatment/procedure(s) were performed by non-physician practitioner and as supervising physician I was immediately available for consultation/collaboration.   Carleene Cooper III, MD 06/09/12 [phone]

## 2012-06-10 ENCOUNTER — Telehealth (HOSPITAL_COMMUNITY): Payer: Self-pay | Admitting: Emergency Medicine

## 2012-06-14 ENCOUNTER — Emergency Department (HOSPITAL_BASED_OUTPATIENT_CLINIC_OR_DEPARTMENT_OTHER)
Admission: EM | Admit: 2012-06-14 | Discharge: 2012-06-14 | Disposition: A | Discharge status: Home / Self Care | Payer: Medicaid Other | Attending: Emergency Medicine | Admitting: Emergency Medicine

## 2012-06-14 ENCOUNTER — Encounter (HOSPITAL_BASED_OUTPATIENT_CLINIC_OR_DEPARTMENT_OTHER): Payer: Self-pay | Admitting: *Deleted

## 2012-06-14 DIAGNOSIS — K0889 Other specified disorders of teeth and supporting structures: Secondary | ICD-10-CM

## 2012-06-14 DIAGNOSIS — M2669 Other specified disorders of temporomandibular joint: Secondary | ICD-10-CM | POA: Insufficient documentation

## 2012-06-14 DIAGNOSIS — Z21 Asymptomatic human immunodeficiency virus [HIV] infection status: Secondary | ICD-10-CM | POA: Insufficient documentation

## 2012-06-14 DIAGNOSIS — K029 Dental caries, unspecified: Secondary | ICD-10-CM | POA: Insufficient documentation

## 2012-06-14 DIAGNOSIS — Z87442 Personal history of urinary calculi: Secondary | ICD-10-CM | POA: Insufficient documentation

## 2012-06-14 DIAGNOSIS — F411 Generalized anxiety disorder: Secondary | ICD-10-CM | POA: Insufficient documentation

## 2012-06-14 DIAGNOSIS — Z880 Allergy status to penicillin: Secondary | ICD-10-CM | POA: Insufficient documentation

## 2012-06-14 DIAGNOSIS — F17200 Nicotine dependence, unspecified, uncomplicated: Secondary | ICD-10-CM | POA: Insufficient documentation

## 2012-06-14 DIAGNOSIS — B20 Human immunodeficiency virus [HIV] disease: Secondary | ICD-10-CM

## 2012-06-14 MED ORDER — OXYCODONE-ACETAMINOPHEN 5-325 MG PO TABS
2.0000 | ORAL_TABLET | Freq: Once | ORAL | Status: AC
  Administered 2012-06-14: 2 via ORAL
  Filled 2012-06-14 (×2): qty 2

## 2012-06-14 MED ORDER — OXYCODONE-ACETAMINOPHEN 5-325 MG PO TABS: 1.0000 | ORAL_TABLET | Freq: Four times a day (QID) | ORAL | Status: DC | PRN

## 2012-06-14 NOTE — ED Provider Notes (Signed)
History     CSN: [id number]  Arrival date & time 06/14/12  1478   First MD Initiated Contact with Patient 06/14/12 1048      Chief Complaint  Patient presents with  . Dental Pain    (Consider location/radiation/quality/duration/timing/severity/associated sxs/prior treatment) HPI Dawn Velasquez is a 26 y.o. female presenting with dental pain tooth #17. Patient does have an appointment with a dentist for root canal- she has seen his dentist for evaluation I've seen a business card and followup appointments. Her next appointment is 06/19/2012, however is not have any pain medicine to get her through until then. Pain is currently 10/10 worsened with cold drinks, chewing. Patient is also HIV positive she had an undetectable viral load and adequate CD4 count on last check. Patient denies any foul taste in the mouth. Denies any fevers or chills, jaw pain, stiff neck or swollen tongue or difficulty swallowing,    Past Medical History  Diagnosis Date  . HIV (human immunodeficiency virus infection)   . TMJ syndrome   . Simple seizure     r/t taking tramadol  . Ovarian cyst   . Kidney stone   . Anxiety     Past Surgical History  Procedure Date  . Appendectomy   . Cesarean section   . Ankle surgery   . Tubal ligation   . Fracture surgery     History reviewed. No pertinent family history.  History  Substance Use Topics  . Smoking status: Current Every Day Smoker -- 0.5 packs/day  . Smokeless tobacco: Not on file  . Alcohol Use: No    OB History    Grav Para Term Preterm Abortions TAB SAB Ect Mult Living                  Review of Systems At least 10pt or greater review of systems completed and are negative except where specified in the HPI.  Allergies  Penicillins; Naproxen; and Tramadol  Home Medications   Current Outpatient Rx  Name Route Sig Dispense Refill  . ALPRAZOLAM 0.5 MG PO TABS Oral Take 0.5 mg by mouth 2 (two) times daily.     Marland Kitchen  EMTRICITABINE-TENOFOVIR 200-300 MG PO TABS Oral Take 1 tablet by mouth 2 (two) times daily. HIV medication.  Consult needed.    Marland Kitchen FOSAMPRENAVIR CALCIUM 700 MG PO TABS Oral Take 1,400 mg by mouth 2 (two) times daily. HIV medication.  Consult needed.    Marland Kitchen HYDROCODONE-ACETAMINOPHEN 5-325 MG PO TABS Oral Take 2 tablets by mouth every 4 (four) hours as needed for pain. 16 tablet 0  . RITONAVIR 100 MG PO CAPS Oral Take 100 mg by mouth 2 (two) times daily. HIV medication.  Consult needed.      BP 121/72  Pulse 79  Temp 98.5 F (36.9 C) (Oral)  Resp 14  Ht 5\' 5"  (1.651 m)  Wt 150 lb (68.04 kg)  BMI 24.96 kg/m2  SpO2 100%  LMP 05/25/2012  Physical Exam  Nursing notes reviewed.  Electronic medical record reviewed. VITAL SIGNS:   Filed Vitals:   06/14/12 1006  BP: 121/72  Pulse: 79  Temp: 98.5 F (36.9 C)  TempSrc: Oral  Resp: 14  Height: 5\' 5"  (1.651 m)  Weight: 150 lb (68.04 kg)  SpO2: 100%   CONSTITUTIONAL: Awake, oriented, appears non-toxic HENT: Atraumatic, normocephalic, oral mucosa pink and moist, airway patent. Nares patent without drainage. External ears normal. Caries #17, tender to percussion. No apical abscess. No erythema, no gingival infection  EYES: Conjunctiva clear, EOMI, PERRLA NECK: Trachea midline, non-tender, supple CARDIOVASCULAR: Normal heart rate, Normal rhythm, No murmurs, rubs, gallops PULMONARY/CHEST: Clear to auscultation, no rhonchi, wheezes, or rales. Symmetrical breath sounds. Non-tender. ABDOMINAL: Non-distended, soft, non-tender - no rebound or guarding.  BS normal. NEUROLOGIC: Non-focal, moving all four extremities, no gross sensory or motor deficits. EXTREMITIES: No clubbing, cyanosis, or edema SKIN: Warm, Dry, No erythema, No rash  ED Course  Procedures (including critical care time)  Labs Reviewed - No data to display No results found.   1. Pain, dental   2. HIV (human immunodeficiency virus infection)       MDM  Dean Buenafe is a 26 y.o. female presenting with dental pain, patient does have a followup with her dentist on 10/1 for root canal. Will fill a pain medicine prescription to get her through until she sees her dentist for definitive treatment. She is advised to no more pain medicine from the ER setting the [facility] area - some pain medicine prescription last and avoid chewing on that side avoid cold drinks any other irritating factors.  Do not think there is any infection requiring antibiotics at this time - she is given return precautions if any infectious symptoms to present she is to return to the ER for evaluation immediately.  DC stable home in good condition.        Jones Skene, MD 06/17/12 1500

## 2012-06-14 NOTE — ED Notes (Signed)
Pt states that she has seen the dentist we referred her to and has another appt on oct 1st says denitst told her would have to have root canal called yesterday to try and get in again since the tooth and her face have started hurting again and swelling.

## 2012-06-26 ENCOUNTER — Emergency Department (HOSPITAL_BASED_OUTPATIENT_CLINIC_OR_DEPARTMENT_OTHER)
Admission: EM | Admit: 2012-06-26 | Discharge: 2012-06-26 | Disposition: A | Discharge status: Home / Self Care | Payer: Medicaid Other | Attending: Emergency Medicine | Admitting: Emergency Medicine

## 2012-06-26 ENCOUNTER — Emergency Department (HOSPITAL_BASED_OUTPATIENT_CLINIC_OR_DEPARTMENT_OTHER): Payer: Medicaid Other

## 2012-06-26 ENCOUNTER — Encounter (HOSPITAL_BASED_OUTPATIENT_CLINIC_OR_DEPARTMENT_OTHER): Payer: Self-pay | Admitting: *Deleted

## 2012-06-26 DIAGNOSIS — S8990XA Unspecified injury of unspecified lower leg, initial encounter: Secondary | ICD-10-CM | POA: Insufficient documentation

## 2012-06-26 DIAGNOSIS — Y9302 Activity, running: Secondary | ICD-10-CM | POA: Insufficient documentation

## 2012-06-26 DIAGNOSIS — X500XXA Overexertion from strenuous movement or load, initial encounter: Secondary | ICD-10-CM | POA: Insufficient documentation

## 2012-06-26 DIAGNOSIS — Y9229 Other specified public building as the place of occurrence of the external cause: Secondary | ICD-10-CM | POA: Insufficient documentation

## 2012-06-26 DIAGNOSIS — Z79899 Other long term (current) drug therapy: Secondary | ICD-10-CM | POA: Insufficient documentation

## 2012-06-26 DIAGNOSIS — Z21 Asymptomatic human immunodeficiency virus [HIV] infection status: Secondary | ICD-10-CM | POA: Insufficient documentation

## 2012-06-26 MED ORDER — OXYCODONE-ACETAMINOPHEN 5-325 MG PO TABS: 1.0000 | ORAL_TABLET | Freq: Four times a day (QID) | ORAL | Status: DC | PRN

## 2012-06-26 NOTE — ED Notes (Signed)
Number for animal control in [place] provided to patient. Patient was inquiring about who to call regarding foxes that chased her today at school. Pt was in [employer]

## 2012-06-26 NOTE — ED Notes (Signed)
Pt reports her right knee popped when she was running inside- ambulatory with limp to triage

## 2012-06-26 NOTE — ED Provider Notes (Signed)
History     CSN: [id number]  Arrival date & time 06/26/12  2141   First MD Initiated Contact with Patient 06/26/12 2225      Chief Complaint  Patient presents with  . Knee Injury    (Consider location/radiation/quality/duration/timing/severity/associated sxs/prior treatment) HPI Pt presents with c/o right knee pain.  She states that she was smoking a cigarette outside of her school and she saw several foxes that were running towards her- she began to run and states she twisted her ankle and heard a pop.  She has had difficulty bearing weight.  Did not fall or strike head.  No neck or back pain.  Was not bitten by a fox.  Movement and palpation make pain worse.  Has not tried anything for her symptoms prior to arrival.  There are no other associated systemic symptoms, there are no other alleviating or modifying factors.   Past Medical History  Diagnosis Date  . HIV (human immunodeficiency virus infection)   . TMJ syndrome   . Simple seizure     r/t taking tramadol  . Ovarian cyst   . Kidney stone   . Anxiety     Past Surgical History  Procedure Date  . Appendectomy   . Cesarean section   . Ankle surgery   . Tubal ligation   . Fracture surgery     No family history on file.  History  Substance Use Topics  . Smoking status: Current Every Day Smoker -- 0.5 packs/day    Types: Cigarettes  . Smokeless tobacco: Never Used  . Alcohol Use: No    OB History    Grav Para Term Preterm Abortions TAB SAB Ect Mult Living                  Review of Systems ROS reviewed and all otherwise negative except for mentioned in HPI  Allergies  Penicillins; Naproxen; and Tramadol  Home Medications   Current Outpatient Rx  Name Route Sig Dispense Refill  . ALPRAZOLAM 0.5 MG PO TABS Oral Take 0.5 mg by mouth 2 (two) times daily.     Marland Kitchen EMTRICITABINE-TENOFOVIR 200-300 MG PO TABS Oral Take 1 tablet by mouth 2 (two) times daily. HIV medication.  Consult needed.    Marland Kitchen FOSAMPRENAVIR  CALCIUM 700 MG PO TABS Oral Take 1,400 mg by mouth 2 (two) times daily. HIV medication.  Consult needed.    . OXYCODONE-ACETAMINOPHEN 5-325 MG PO TABS Oral Take 1-2 tablets by mouth every 6 (six) hours as needed for pain. 23 tablet 0  . OXYCODONE-ACETAMINOPHEN 5-325 MG PO TABS Oral Take 1-2 tablets by mouth every 6 (six) hours as needed for pain. 15 tablet 0  . RITONAVIR 100 MG PO CAPS Oral Take 100 mg by mouth 2 (two) times daily. HIV medication.  Consult needed.      BP 128/96  Pulse 84  Temp 98.1 F (36.7 C) (Oral)  Resp 20  SpO2 100%  LMP 06/25/2012 Vitals reviewed Physical Exam Physical Examination: General appearance - alert, well appearing, and in no distress Mental status - alert, oriented to person, place, and time Eyes - pupils equal and reactive, extraocular eye movements intact Neck - supple, no significant adenopathy Chest - clear to auscultation, no wheezes, rales or rhonchi, symmetric air entry Heart - normal rate, regular rhythm, normal S1, S2, no murmurs, rubs, clicks or gallops Neurological - alert, oriented, normal speech, strength 5/5 in extremities x 4, sensation intact distally Musculoskeletal - ttp over medial right  knee, no deformity, some pain with ROM of knee, negative anterior drawer sign, otherwise in other extremities, no joint tenderness, deformity or swelling Extremities - peripheral pulses normal, no pedal edema, no clubbing or cyanosis Skin - normal coloration and turgor, no rashes, no contusions or abrasions  ED Course  Procedures (including critical care time)  Labs Reviewed - No data to display Dg Knee Complete 4 Views Right  06/26/2012  *RADIOLOGY REPORT*  Clinical Data: 26 year old female with pain after injury.  RIGHT KNEE - COMPLETE 4+ VIEW  Comparison: 07/23/2010.  Findings: No joint effusion identified. Bone mineralization is within normal limits.  Patella intact.  Joint spaces preserved.  No fracture dislocation.  IMPRESSION: No acute fracture  or dislocation identified about the right knee.   Original Report Authenticated By: Harley Hallmark, M.D.      1. Knee injury       MDM  Pt presenting with c/o right knee pain, states was running and felt knee twist and pop.  Some tenderness over medial aspect of knee.  Xray reassuring.  Pt placed in knee immobilizer, given crutches and meds for pain control.  Discharged with strict return precautions.  Pt agreeable with plan.        Ethelda Chick, MD 06/28/12 [phone]

## 2012-07-02 ENCOUNTER — Encounter (HOSPITAL_BASED_OUTPATIENT_CLINIC_OR_DEPARTMENT_OTHER): Payer: Self-pay | Admitting: Emergency Medicine

## 2012-07-02 ENCOUNTER — Emergency Department (HOSPITAL_BASED_OUTPATIENT_CLINIC_OR_DEPARTMENT_OTHER)
Admission: EM | Admit: 2012-07-02 | Discharge: 2012-07-02 | Disposition: A | Discharge status: Home / Self Care | Payer: Medicaid Other | Attending: Emergency Medicine | Admitting: Emergency Medicine

## 2012-07-02 DIAGNOSIS — F17200 Nicotine dependence, unspecified, uncomplicated: Secondary | ICD-10-CM | POA: Insufficient documentation

## 2012-07-02 DIAGNOSIS — F411 Generalized anxiety disorder: Secondary | ICD-10-CM | POA: Insufficient documentation

## 2012-07-02 DIAGNOSIS — M25569 Pain in unspecified knee: Secondary | ICD-10-CM | POA: Insufficient documentation

## 2012-07-02 DIAGNOSIS — Z21 Asymptomatic human immunodeficiency virus [HIV] infection status: Secondary | ICD-10-CM | POA: Insufficient documentation

## 2012-07-02 MED ORDER — ACETAMINOPHEN-CODEINE #3 300-30 MG PO TABS: 1.0000 | ORAL_TABLET | Freq: Four times a day (QID) | ORAL | Status: DC | PRN

## 2012-07-02 NOTE — ED Provider Notes (Signed)
History  This chart was scribed for Dawn Lyons, MD by Erskine Emery. This patient was seen in room MH05/MH05 and the patient's care was started at 22:03.   CSN: [id number]  Arrival date & time 07/02/12  2141   First MD Initiated Contact with Patient 07/02/12 2203      Chief Complaint  Patient presents with  . Knee Pain    (Consider location/radiation/quality/duration/timing/severity/associated sxs/prior treatment) The history is provided by the patient. No language interpreter was used.  Erendida Wrenn is a 26 y.o. female who presents to the Emergency Department complaining of right knee tightness and tingling with associated pain upon ambulation. Pt reports she was here last week after falling while being chased by some foxes; she was given a knee immobilizer and crutches. Pt reports she has been taking Goody powers and Ibuprofen, which only temporarily relieve the pain. Pt reports her old PCP and the orthopedist won't see her because she is in the process of getting medicaid. Pt's records show she has been to the ED many times for various painful complaints. Pt has a h/o HIV, TMJ, ovarian cyst, kidney stones, and anxiety.  Past Medical History  Diagnosis Date  . HIV (human immunodeficiency virus infection)   . TMJ syndrome   . Simple seizure     r/t taking tramadol  . Ovarian cyst   . Kidney stone   . Anxiety     Past Surgical History  Procedure Date  . Appendectomy   . Cesarean section   . Ankle surgery   . Tubal ligation   . Fracture surgery     No family history on file.  History  Substance Use Topics  . Smoking status: Current Every Day Smoker -- 0.5 packs/day    Types: Cigarettes  . Smokeless tobacco: Never Used  . Alcohol Use: No    OB History    Grav Para Term Preterm Abortions TAB SAB Ect Mult Living                  Review of Systems  Constitutional: Negative for fever and chills.  HENT: Negative for congestion.   Eyes: Negative for  visual disturbance.  Respiratory: Negative for shortness of breath.   Gastrointestinal: Negative for nausea and vomiting.  Musculoskeletal:       Right knee pain  Skin: Negative for wound.  Neurological: Negative for weakness and numbness.    Allergies  Penicillins; Naproxen; and Tramadol  Home Medications   Current Outpatient Rx  Name Route Sig Dispense Refill  . ALPRAZOLAM 0.5 MG PO TABS Oral Take 0.5 mg by mouth 2 (two) times daily.     Marland Kitchen EMTRICITABINE-TENOFOVIR 200-300 MG PO TABS Oral Take 1 tablet by mouth 2 (two) times daily. HIV medication.  Consult needed.    Marland Kitchen FOSAMPRENAVIR CALCIUM 700 MG PO TABS Oral Take 1,400 mg by mouth 2 (two) times daily. HIV medication.  Consult needed.    . OXYCODONE-ACETAMINOPHEN 5-325 MG PO TABS Oral Take 1-2 tablets by mouth every 6 (six) hours as needed for pain. 23 tablet 0  . OXYCODONE-ACETAMINOPHEN 5-325 MG PO TABS Oral Take 1-2 tablets by mouth every 6 (six) hours as needed for pain. 15 tablet 0  . RITONAVIR 100 MG PO CAPS Oral Take 100 mg by mouth 2 (two) times daily. HIV medication.  Consult needed.      Triage Vitals: BP 125/85  Pulse 99  Temp 98.4 F (36.9 C) (Oral)  Resp 18  Ht 5\' 5"  (1.651  m)  Wt 148 lb (67.132 kg)  BMI 24.63 kg/m2  SpO2 100%  LMP 06/25/2012  Physical Exam  Nursing note and vitals reviewed. Constitutional: She is oriented to person, place, and time. She appears well-developed and well-nourished. No distress.  HENT:  Head: Normocephalic and atraumatic.  Eyes: EOM are normal.  Neck: Neck supple. No tracheal deviation present.  Cardiovascular: Normal rate.   Pulmonary/Chest: Effort normal. No respiratory distress.  Abdominal: Soft. She exhibits no distension.  Musculoskeletal: Normal range of motion. She exhibits no edema.       Right knee: Knee appears grossly normal, no swelling, no effusion. Full ROM without crepitous. Knee is stable AP and laterally. Anterior-posterior drawer test is negative.    Neurological: She is alert and oriented to person, place, and time.  Skin: Skin is warm and dry.  Psychiatric: She has a normal mood and affect.    ED Course  Procedures (including critical care time) DIAGNOSTIC STUDIES: Oxygen Saturation is 100% on room air, normal by my interpretation.    COORDINATION OF CARE: 22:15--I evaluated the patient and we discussed a treatment plan including Tylenol-codeine to which the pt agreed.    Labs Reviewed - No data to display No results found.   No diagnosis found.    MDM  The patient presents here with knee pain that is out of proportion with the normal exam.  She has a history of multiple visits to the ED with painful conditions.  I suspect she is drug-seeking.  I will prescribe her a small quantity of T3's but have informed here this is the last time I will do this for chronic conditions.      I personally performed the services described in this documentation, which was scribed in my presence. The recorded information has been reviewed and considered.      Dawn Lyons, MD 07/04/12 [phone]

## 2012-07-02 NOTE — ED Notes (Signed)
Injury to rt knee x 1week ago.  Unable to see ortho and pain is worse

## 2012-07-13 ENCOUNTER — Encounter (HOSPITAL_COMMUNITY): Payer: Self-pay | Admitting: *Deleted

## 2012-07-13 ENCOUNTER — Emergency Department (HOSPITAL_COMMUNITY)
Admission: EM | Admit: 2012-07-13 | Discharge: 2012-07-13 | Disposition: A | Discharge status: Home / Self Care | Payer: Medicaid Other | Attending: Emergency Medicine | Admitting: Emergency Medicine

## 2012-07-13 DIAGNOSIS — F17200 Nicotine dependence, unspecified, uncomplicated: Secondary | ICD-10-CM | POA: Insufficient documentation

## 2012-07-13 DIAGNOSIS — Z79899 Other long term (current) drug therapy: Secondary | ICD-10-CM | POA: Insufficient documentation

## 2012-07-13 DIAGNOSIS — R319 Hematuria, unspecified: Secondary | ICD-10-CM | POA: Insufficient documentation

## 2012-07-13 DIAGNOSIS — Z8739 Personal history of other diseases of the musculoskeletal system and connective tissue: Secondary | ICD-10-CM | POA: Insufficient documentation

## 2012-07-13 DIAGNOSIS — B20 Human immunodeficiency virus [HIV] disease: Secondary | ICD-10-CM | POA: Insufficient documentation

## 2012-07-13 DIAGNOSIS — F411 Generalized anxiety disorder: Secondary | ICD-10-CM | POA: Insufficient documentation

## 2012-07-13 DIAGNOSIS — Z8669 Personal history of other diseases of the nervous system and sense organs: Secondary | ICD-10-CM | POA: Insufficient documentation

## 2012-07-13 DIAGNOSIS — R110 Nausea: Secondary | ICD-10-CM | POA: Insufficient documentation

## 2012-07-13 DIAGNOSIS — Z87442 Personal history of urinary calculi: Secondary | ICD-10-CM | POA: Insufficient documentation

## 2012-07-13 DIAGNOSIS — Z8742 Personal history of other diseases of the female genital tract: Secondary | ICD-10-CM | POA: Insufficient documentation

## 2012-07-13 DIAGNOSIS — R109 Unspecified abdominal pain: Secondary | ICD-10-CM | POA: Insufficient documentation

## 2012-07-13 LAB — URINALYSIS, ROUTINE W REFLEX MICROSCOPIC
Bilirubin Urine: NEGATIVE
Nitrite: NEGATIVE
Specific Gravity, Urine: >1.03 — ABNORMAL HIGH (ref 1.005–1.030)
Urobilinogen, UA: 0.2 mg/dL (ref 0.0–1.0)

## 2012-07-13 LAB — CBC WITH DIFFERENTIAL/PLATELET
Basophils Relative: 1 % (ref 0–1)
Eosinophils Absolute: 0.3 10*3/uL (ref 0.0–0.7)
Lymphs Abs: 1.6 10*3/uL (ref 0.7–4.0)
MCH: 31 pg (ref 26.0–34.0)
MCHC: 34.9 g/dL (ref 30.0–36.0)
Neutrophils Relative %: 49 % (ref 43–77)
Platelets: 179 10*3/uL (ref 150–400)
RBC: 4.58 MIL/uL (ref 3.87–5.11)

## 2012-07-13 LAB — COMPREHENSIVE METABOLIC PANEL
ALT: 16 U/L (ref 0–35)
AST: 14 U/L (ref 0–37)
Albumin: 4.1 g/dL (ref 3.5–5.2)
Alkaline Phosphatase: 55 U/L (ref 39–117)
Potassium: 3.6 mEq/L (ref 3.5–5.1)
Sodium: 138 mEq/L (ref 135–145)
Total Protein: 7.8 g/dL (ref 6.0–8.3)

## 2012-07-13 LAB — URINE MICROSCOPIC-ADD ON

## 2012-07-13 MED ORDER — HYDROMORPHONE HCL PF 1 MG/ML IJ SOLN
1.0000 mg | Freq: Once | INTRAMUSCULAR | Status: AC
  Administered 2012-07-13: 1 mg via INTRAVENOUS
  Filled 2012-07-13: qty 1

## 2012-07-13 MED ORDER — CIPROFLOXACIN HCL 500 MG PO TABS: 500.0000 mg | ORAL_TABLET | Freq: Two times a day (BID) | ORAL | Status: DC

## 2012-07-13 MED ORDER — HYDROMORPHONE HCL 2 MG PO TABS: 2.0000 mg | ORAL_TABLET | Freq: Four times a day (QID) | ORAL | Status: DC | PRN

## 2012-07-13 MED ORDER — ONDANSETRON HCL 4 MG/2ML IJ SOLN
4.0000 mg | Freq: Once | INTRAMUSCULAR | Status: AC
  Administered 2012-07-13: 4 mg via INTRAVENOUS
  Filled 2012-07-13: qty 2

## 2012-07-13 NOTE — ED Notes (Signed)
Pt has hx of kidney stones, pt states feels the same as before, current episode started approx 5pm, pain on lt flank radiating to front, blood in urine. Reports nausea, denies emesis

## 2012-07-13 NOTE — ED Provider Notes (Signed)
History     CSN: [id number]  Arrival date & time 07/13/12  1940   First MD Initiated Contact with Patient 07/13/12 2019      Chief Complaint  Patient presents with  . Flank Pain    lt side  . Nausea  . Hematuria   Patient is a 26 y.o. female presenting with hematuria and flank pain. The history is provided by the patient. No language interpreter was used.  Hematuria This is a recurrent problem. The current episode started in the past 7 days. The problem has been gradually worsening since onset. She describes the hematuria as gross hematuria. The pain is moderate. She describes her urine color as dark red. Associated symptoms include dysuria, flank pain and nausea. Pertinent negatives include no abdominal pain. Her past medical history is significant for kidney stones.  Flank Pain This is a recurrent problem. The current episode started 2 days ago. The problem occurs constantly. The problem has been gradually worsening. Pertinent negatives include no chest pain, no abdominal pain and no headaches. Exacerbated by: urination. Nothing relieves the symptoms. She has tried nothing for the symptoms. The treatment provided no relief.   Dawn Velasquez is a 26 y.o. female who presents to the Emergency Department complaining of 1 month of sudden onset, constant , dark hematuria with 2 days of associate sudden onset flank pain. Pain is constant, aggravated with palpation and urination, and alleviated by nothing. Symptoms have not been treated PTA. No fever, chills, cough, congestion, rhinorrhea, chest pain, SOB, or n/v/d. Medical Hx includes HIV, TMJ, Ovarian cysts, Anxiety, and kidney stones.    Past Medical History  Diagnosis Date  . HIV (human immunodeficiency virus infection)   . TMJ syndrome   . Simple seizure     r/t taking tramadol  . Ovarian cyst   . Kidney stone   . Anxiety     Past Surgical History  Procedure Date  . Appendectomy   . Cesarean section   . Ankle surgery    . Tubal ligation   . Fracture surgery   . Lithotripsy     History reviewed. No pertinent family history.  History  Substance Use Topics  . Smoking status: Current Every Day Smoker -- 0.5 packs/day    Types: Cigarettes  . Smokeless tobacco: Never Used  . Alcohol Use: No   Review of Systems  Cardiovascular: Negative for chest pain.  Gastrointestinal: Positive for nausea. Negative for abdominal pain.  Genitourinary: Positive for dysuria, hematuria and flank pain.  Neurological: Negative for headaches.  All other systems reviewed and are negative.    Allergies  Penicillins; Naproxen; and Tramadol  Home Medications   Current Outpatient Rx  Name Route Sig Dispense Refill  . ALPRAZOLAM 0.5 MG PO TABS Oral Take 0.5 mg by mouth 2 (two) times daily.     Marland Kitchen EMTRICITABINE-TENOFOVIR 200-300 MG PO TABS Oral Take 1 tablet by mouth 2 (two) times daily. HIV medication.  Consult needed.    Marland Kitchen FOSAMPRENAVIR CALCIUM 700 MG PO TABS Oral Take 1,400 mg by mouth 2 (two) times daily. HIV medication.  Consult needed.    Marland Kitchen RITONAVIR 100 MG PO CAPS Oral Take 100 mg by mouth 2 (two) times daily. HIV medication.  Consult needed.      BP 123/77  Pulse 118  Temp 98.1 F (36.7 C) (Oral)  Resp 24  Ht 5\' 5"  (1.651 m)  Wt 148 lb (67.132 kg)  BMI 24.63 kg/m2  SpO2 100%  LMP 06/25/2012  Physical Exam  Constitutional: She is oriented to person, place, and time. She appears well-developed.  HENT:  Head: Normocephalic and atraumatic.  Eyes: Conjunctivae normal and EOM are normal. No scleral icterus.  Neck: Neck supple. No thyromegaly present.  Cardiovascular: Normal rate and regular rhythm.  Exam reveals no gallop and no friction rub.   No murmur heard. Pulmonary/Chest: No stridor. She has no wheezes. She has no rales. She exhibits no tenderness.  Abdominal: She exhibits no distension. There is no rebound.       LLQ and left flank tenderness.  Musculoskeletal: Normal range of motion. She exhibits no  edema.  Lymphadenopathy:    She has no cervical adenopathy.  Neurological: She is oriented to person, place, and time. Coordination normal.  Skin: No rash noted. No erythema.  Psychiatric: She has a normal mood and affect. Her behavior is normal.    ED Course  Procedures DIAGNOSTIC STUDIES: Oxygen Saturation is 100% on room air, normal by my interpretation.    COORDINATION OF CARE: 20:54- Evaluated Pt. Pt is awake, alert, and appears uncomfortable.    Labs Reviewed - No data to display No results found.   No diagnosis found.    MDM      The chart was scribed for me under my direct supervision.  I personally performed the history, physical, and medical decision making and all procedures in the evaluation of this patient.Benny Lennert, MD 07/13/12 2219

## 2012-07-15 LAB — URINE CULTURE: Colony Count: 25000

## 2012-07-16 ENCOUNTER — Encounter (HOSPITAL_BASED_OUTPATIENT_CLINIC_OR_DEPARTMENT_OTHER): Payer: Self-pay | Admitting: *Deleted

## 2012-07-16 ENCOUNTER — Emergency Department (HOSPITAL_BASED_OUTPATIENT_CLINIC_OR_DEPARTMENT_OTHER)
Admission: EM | Admit: 2012-07-16 | Discharge: 2012-07-16 | Disposition: A | Discharge status: Home / Self Care | Payer: Medicaid Other | Attending: Emergency Medicine | Admitting: Emergency Medicine

## 2012-07-16 DIAGNOSIS — M545 Low back pain, unspecified: Secondary | ICD-10-CM | POA: Insufficient documentation

## 2012-07-16 DIAGNOSIS — R109 Unspecified abdominal pain: Secondary | ICD-10-CM | POA: Insufficient documentation

## 2012-07-16 DIAGNOSIS — R319 Hematuria, unspecified: Secondary | ICD-10-CM

## 2012-07-16 DIAGNOSIS — F411 Generalized anxiety disorder: Secondary | ICD-10-CM | POA: Insufficient documentation

## 2012-07-16 DIAGNOSIS — F17200 Nicotine dependence, unspecified, uncomplicated: Secondary | ICD-10-CM | POA: Insufficient documentation

## 2012-07-16 DIAGNOSIS — R569 Unspecified convulsions: Secondary | ICD-10-CM | POA: Insufficient documentation

## 2012-07-16 DIAGNOSIS — Z79899 Other long term (current) drug therapy: Secondary | ICD-10-CM | POA: Insufficient documentation

## 2012-07-16 DIAGNOSIS — B20 Human immunodeficiency virus [HIV] disease: Secondary | ICD-10-CM | POA: Insufficient documentation

## 2012-07-16 DIAGNOSIS — K921 Melena: Secondary | ICD-10-CM | POA: Insufficient documentation

## 2012-07-16 LAB — URINALYSIS, ROUTINE W REFLEX MICROSCOPIC
Glucose, UA: NEGATIVE mg/dL
Protein, ur: NEGATIVE mg/dL
Specific Gravity, Urine: 1.019 (ref 1.005–1.030)
pH: 5.5 (ref 5.0–8.0)

## 2012-07-16 LAB — PREGNANCY, URINE: Preg Test, Ur: NEGATIVE

## 2012-07-16 LAB — URINE MICROSCOPIC-ADD ON

## 2012-07-16 MED ORDER — ONDANSETRON HCL 4 MG/2ML IJ SOLN
4.0000 mg | Freq: Once | INTRAMUSCULAR | Status: DC
  Filled 2012-07-16: qty 2

## 2012-07-16 MED ORDER — HYDROMORPHONE HCL PF 1 MG/ML IJ SOLN
1.0000 mg | Freq: Once | INTRAMUSCULAR | Status: DC
  Filled 2012-07-16: qty 1

## 2012-07-16 MED ORDER — HYDROMORPHONE HCL PF 1 MG/ML IJ SOLN
1.0000 mg | Freq: Once | INTRAMUSCULAR | Status: AC
  Administered 2012-07-16: 1 mg via INTRAMUSCULAR

## 2012-07-16 MED ORDER — OXYCODONE-ACETAMINOPHEN 5-325 MG PO TABS: 1.0000 | ORAL_TABLET | ORAL | Status: DC | PRN

## 2012-07-16 MED ORDER — ONDANSETRON HCL 4 MG/2ML IJ SOLN
4.0000 mg | Freq: Once | INTRAMUSCULAR | Status: AC
  Administered 2012-07-16: 4 mg via INTRAMUSCULAR

## 2012-07-16 NOTE — ED Provider Notes (Signed)
History   This chart was scribed for Dawn Cooper III, MD by Albertha Ghee Rifaie. This patient was seen in room MH05/MH05 and the patient's care was started at 10:11 PM.    CSN: [id number]  Arrival date & time 07/16/12  2003   None     Chief Complaint  Patient presents with  . Hematuria     The history is provided by the patient. No language interpreter was used.    Dawn Velasquez is a 26 y.o. female who presents to the Emergency Department complaining of 1 months of sudden onset, gradually worsening, constant, dark hematuria associated with intermittent, sever back pain and lower abd pain. pain is worsen by palpation and urination. She denies HA, CP, SOB, rhinorrhea, fever, chills, and nausea. Pt was seen 3 days ago for similar symptoms and was discharged with Cipro prescription and she states that she has been taking it as prescribed but it is too strong for her.She has an appointment to see a urologist next week. She also c/o having pain during intercourse. Pt is a current everyday smoker but denies alcohol use.     Past Medical History  Diagnosis Date  . HIV (human immunodeficiency virus infection)   . TMJ syndrome   . Simple seizure     r/t taking tramadol  . Ovarian cyst   . Kidney stone   . Anxiety     Past Surgical History  Procedure Date  . Appendectomy   . Cesarean section   . Ankle surgery   . Tubal ligation   . Fracture surgery   . Lithotripsy     No family history on file.  History  Substance Use Topics  . Smoking status: Current Every Day Smoker -- 0.5 packs/day    Types: Cigarettes  . Smokeless tobacco: Never Used  . Alcohol Use: No    No OB history provided  Review of Systems  Constitutional: Negative for fever and chills.  HENT: Negative for sore throat and neck pain.   Gastrointestinal: Positive for abdominal pain and blood in stool. Negative for nausea and diarrhea.  Genitourinary: Positive for flank pain.  Musculoskeletal:  Positive for back pain.    Allergies  Penicillins; Naproxen; and Tramadol  Home Medications   Current Outpatient Rx  Name Route Sig Dispense Refill  . ALPRAZOLAM 0.5 MG PO TABS Oral Take 0.5 mg by mouth 2 (two) times daily.     Marland Kitchen CIPROFLOXACIN HCL 500 MG PO TABS Oral Take 1 tablet (500 mg total) by mouth 2 (two) times daily. 20 tablet 0  . EMTRICITABINE-TENOFOVIR 200-300 MG PO TABS Oral Take 1 tablet by mouth 2 (two) times daily. HIV medication.  Consult needed.    Marland Kitchen FOSAMPRENAVIR CALCIUM 700 MG PO TABS Oral Take 1,400 mg by mouth 2 (two) times daily. HIV medication.  Consult needed.    Marland Kitchen HYDROMORPHONE HCL 2 MG PO TABS Oral Take 1 tablet (2 mg total) by mouth every 6 (six) hours as needed for pain. 30 tablet 0  . RITONAVIR 100 MG PO CAPS Oral Take 100 mg by mouth 2 (two) times daily. HIV medication.  Consult needed.      BP 108/67  Pulse 76  Temp 98 F (36.7 C) (Oral)  Resp 20  SpO2 100%  LMP 06/25/2012  Physical Exam  Constitutional: She is oriented to person, place, and time. She appears well-developed and well-nourished.  HENT:  Head: Normocephalic and atraumatic.  Neck: Normal range of motion.  Cardiovascular: Normal rate  and regular rhythm.   Pulmonary/Chest: Effort normal and breath sounds normal.  Abdominal: Soft.       Localized pain to the left flank and suprapubic region with no mass or tenderness.   Musculoskeletal: Normal range of motion.  Neurological: She is alert and oriented to person, place, and time.  Skin: Skin is warm.    ED Course  Procedures (including critical care time)  Labs Reviewed  URINALYSIS, ROUTINE W REFLEX MICROSCOPIC - Abnormal; Notable for the following:    Color, Urine RED (*)  BIOCHEMICALS MAY BE AFFECTED BY COLOR   APPearance CLOUDY (*)     Hgb urine dipstick LARGE (*)     Leukocytes, UA TRACE (*)     All other components within normal limits  PREGNANCY, URINE  URINE MICROSCOPIC-ADD ON   No results found.  DIAGNOSTIC  STUDIES: Oxygen Saturation is 100% on room air, normal by my interpretation.    COORDINATION OF CARE: 10:22 PM Discussed treatment plan which includes pain medicine with pt at bedside and pt agreed to plan.  Pt has recurrent bouts of hematuria.  She has an appointment to see a urologist on November 8th.  Her urinalysis showed hematuria, no evidence of infection.  Advised to finish out Cipro Rx, take Percocet for pain.  1. Hematuria     I personally performed the services described in this documentation, which was scribed in my presence. The recorded information has been reviewed and considered.  Osvaldo Human, M.D.     Dawn Cooper III, MD 07/17/12 [phone]

## 2012-07-16 NOTE — ED Notes (Signed)
Pt reports hematuria for past two months. Reports going to [facility] 10/25 and prescribed Cipro and Dilaudid. States Dilaudid is too strong and wants percocet until she can see the OBGYN on 11/6 and the Urologist on 11/8. Rates back pain 10/10.

## 2012-07-16 NOTE — ED Notes (Signed)
Pt d/c with ride- rx x 1 given for percocet 

## 2012-07-16 NOTE — ED Notes (Signed)
Hematuria for 2 months. Was started on Cipro 3 days ago. Hematuria continues per pt. She has an appointment to see a urologist next week. She was given Dilaudid but states it is to strong.

## 2012-07-24 ENCOUNTER — Encounter (HOSPITAL_BASED_OUTPATIENT_CLINIC_OR_DEPARTMENT_OTHER): Payer: Self-pay

## 2012-07-24 ENCOUNTER — Emergency Department (HOSPITAL_BASED_OUTPATIENT_CLINIC_OR_DEPARTMENT_OTHER): Payer: Medicaid Other

## 2012-07-24 ENCOUNTER — Emergency Department (HOSPITAL_BASED_OUTPATIENT_CLINIC_OR_DEPARTMENT_OTHER)
Admission: EM | Admit: 2012-07-24 | Discharge: 2012-07-24 | Disposition: A | Discharge status: Home / Self Care | Payer: Medicaid Other | Attending: Emergency Medicine | Admitting: Emergency Medicine

## 2012-07-24 DIAGNOSIS — N83209 Unspecified ovarian cyst, unspecified side: Secondary | ICD-10-CM

## 2012-07-24 DIAGNOSIS — B20 Human immunodeficiency virus [HIV] disease: Secondary | ICD-10-CM | POA: Insufficient documentation

## 2012-07-24 DIAGNOSIS — R110 Nausea: Secondary | ICD-10-CM | POA: Insufficient documentation

## 2012-07-24 DIAGNOSIS — Z8669 Personal history of other diseases of the nervous system and sense organs: Secondary | ICD-10-CM | POA: Insufficient documentation

## 2012-07-24 DIAGNOSIS — Z87442 Personal history of urinary calculi: Secondary | ICD-10-CM | POA: Insufficient documentation

## 2012-07-24 DIAGNOSIS — N949 Unspecified condition associated with female genital organs and menstrual cycle: Secondary | ICD-10-CM | POA: Insufficient documentation

## 2012-07-24 DIAGNOSIS — R102 Pelvic and perineal pain: Secondary | ICD-10-CM

## 2012-07-24 DIAGNOSIS — F17200 Nicotine dependence, unspecified, uncomplicated: Secondary | ICD-10-CM | POA: Insufficient documentation

## 2012-07-24 DIAGNOSIS — F411 Generalized anxiety disorder: Secondary | ICD-10-CM | POA: Insufficient documentation

## 2012-07-24 DIAGNOSIS — R630 Anorexia: Secondary | ICD-10-CM | POA: Insufficient documentation

## 2012-07-24 DIAGNOSIS — Z888 Allergy status to other drugs, medicaments and biological substances status: Secondary | ICD-10-CM | POA: Insufficient documentation

## 2012-07-24 DIAGNOSIS — Z880 Allergy status to penicillin: Secondary | ICD-10-CM | POA: Insufficient documentation

## 2012-07-24 DIAGNOSIS — Z79899 Other long term (current) drug therapy: Secondary | ICD-10-CM | POA: Insufficient documentation

## 2012-07-24 LAB — URINE MICROSCOPIC-ADD ON

## 2012-07-24 LAB — CBC WITH DIFFERENTIAL/PLATELET
Hemoglobin: 14.6 g/dL (ref 12.0–15.0)
Lymphocytes Relative: 28 % (ref 12–46)
Lymphs Abs: 2 10*3/uL (ref 0.7–4.0)
MCV: 86 fL (ref 78.0–100.0)
Monocytes Relative: 7 % (ref 3–12)
Neutrophils Relative %: 62 % (ref 43–77)
Platelets: 217 10*3/uL (ref 150–400)
RBC: 4.77 MIL/uL (ref 3.87–5.11)
WBC: 7.2 10*3/uL (ref 4.0–10.5)

## 2012-07-24 LAB — COMPREHENSIVE METABOLIC PANEL
ALT: 24 U/L (ref 0–35)
Alkaline Phosphatase: 62 U/L (ref 39–117)
CO2: 24 mEq/L (ref 19–32)
GFR calc Af Amer: >90 mL/min (ref >90)
GFR calc non Af Amer: 86 mL/min — ABNORMAL LOW (ref >90)
Glucose, Bld: 121 mg/dL — ABNORMAL HIGH (ref 70–99)
Potassium: 3.4 mEq/L — ABNORMAL LOW (ref 3.5–5.1)
Sodium: 140 mEq/L (ref 135–145)
Total Bilirubin: 0.4 mg/dL (ref 0.3–1.2)

## 2012-07-24 LAB — URINALYSIS, ROUTINE W REFLEX MICROSCOPIC
Bilirubin Urine: NEGATIVE
Ketones, ur: NEGATIVE mg/dL
Specific Gravity, Urine: 1.022 (ref 1.005–1.030)
Urobilinogen, UA: 0.2 mg/dL (ref 0.0–1.0)

## 2012-07-24 MED ORDER — ONDANSETRON HCL 4 MG/2ML IJ SOLN
4.0000 mg | Freq: Once | INTRAMUSCULAR | Status: AC
  Administered 2012-07-24: 4 mg via INTRAVENOUS
  Filled 2012-07-24: qty 2

## 2012-07-24 MED ORDER — HYDROMORPHONE HCL PF 1 MG/ML IJ SOLN
1.0000 mg | Freq: Once | INTRAMUSCULAR | Status: AC
  Administered 2012-07-24: 1 mg via INTRAVENOUS
  Filled 2012-07-24: qty 1

## 2012-07-24 NOTE — ED Provider Notes (Addendum)
History     CSN: [id number]  Arrival date & time 07/24/12  1908   First MD Initiated Contact with Patient 07/24/12 1924      Chief Complaint  Patient presents with  . lower abdominal pain/back pain     (Consider location/radiation/quality/duration/timing/severity/associated sxs/prior treatment) Patient is a 26 y.o. female presenting with abdominal pain. The history is provided by the patient.  Abdominal Pain The primary symptoms of the illness include abdominal pain and nausea. The primary symptoms of the illness do not include fever, vomiting, diarrhea, dysuria, vaginal discharge or vaginal bleeding. The current episode started 3 to 5 hours ago. The onset of the illness was sudden. The problem has not changed since onset. The abdominal pain is located in the suprapubic region and groin (pelvic pain worse on the left). The abdominal pain radiates to the back. The severity of the abdominal pain is 9/10. The abdominal pain is relieved by nothing. The abdominal pain is exacerbated by certain positions.  The patient has not had a change in bowel habit. Additional symptoms associated with the illness include anorexia. Symptoms associated with the illness do not include chills or constipation. Significant associated medical issues include HIV. Associated medical issues comments: states recently saw GYN and may have endometriosis.    Past Medical History  Diagnosis Date  . HIV (human immunodeficiency virus infection)   . TMJ syndrome   . Simple seizure     r/t taking tramadol  . Ovarian cyst   . Kidney stone   . Anxiety     Past Surgical History  Procedure Date  . Appendectomy   . Cesarean section   . Ankle surgery   . Tubal ligation   . Fracture surgery   . Lithotripsy     No family history on file.  History  Substance Use Topics  . Smoking status: Current Every Day Smoker -- 0.5 packs/day    Types: Cigarettes  . Smokeless tobacco: Never Used  . Alcohol Use: No    OB  History    Grav Para Term Preterm Abortions TAB SAB Ect Mult Living                  Review of Systems  Constitutional: Negative for fever and chills.  Gastrointestinal: Positive for nausea, abdominal pain and anorexia. Negative for vomiting, diarrhea and constipation.  Genitourinary: Negative for dysuria, vaginal bleeding and vaginal discharge.  All other systems reviewed and are negative.    Allergies  Penicillins; Naproxen; and Tramadol  Home Medications   Current Outpatient Rx  Name  Route  Sig  Dispense  Refill  . HYDROMORPHONE HCL 2 MG PO TABS   Oral   Take 1 tablet (2 mg total) by mouth every 6 (six) hours as needed for pain.   30 tablet   0   . ALPRAZOLAM 0.5 MG PO TABS   Oral   Take 0.5 mg by mouth 2 (two) times daily.          Marland Kitchen EMTRICITABINE-TENOFOVIR 200-300 MG PO TABS   Oral   Take 1 tablet by mouth 2 (two) times daily. HIV medication.  Consult needed.         Marland Kitchen FOSAMPRENAVIR CALCIUM 700 MG PO TABS   Oral   Take 1,400 mg by mouth 2 (two) times daily. HIV medication.  Consult needed.         Marland Kitchen RITONAVIR 100 MG PO CAPS   Oral   Take 100 mg by mouth 2 (two)  times daily. HIV medication.  Consult needed.           BP 127/84  Pulse 122  Temp 98.4 F (36.9 C)  Resp 20  SpO2 100%  LMP 06/25/2012  Physical Exam  Nursing note and vitals reviewed. Constitutional: She is oriented to person, place, and time. She appears well-developed and well-nourished. No distress.  HENT:  Head: Normocephalic and atraumatic.  Mouth/Throat: Oropharynx is clear and moist.  Eyes: Conjunctivae normal and EOM are normal. Pupils are equal, round, and reactive to light.  Neck: Normal range of motion. Neck supple.  Cardiovascular: Normal rate, regular rhythm and intact distal pulses.   No murmur heard. Pulmonary/Chest: Effort normal and breath sounds normal. No respiratory distress. She has no wheezes. She has no rales.  Abdominal: Soft. She exhibits no distension.  There is tenderness in the suprapubic area. There is guarding. There is no rebound and no CVA tenderness.    Genitourinary: Uterus is not tender. Cervix exhibits no motion tenderness. Right adnexum displays tenderness. Right adnexum displays no mass and no fullness. Left adnexum displays tenderness. Left adnexum displays no mass and no fullness. No tenderness around the vagina. No vaginal discharge found.  Musculoskeletal: Normal range of motion. She exhibits no edema and no tenderness.  Neurological: She is alert and oriented to person, place, and time.  Skin: Skin is warm and dry. No rash noted. No erythema.  Psychiatric: She has a normal mood and affect. Her behavior is normal.    ED Course  Procedures (including critical care time)  Labs Reviewed  URINALYSIS, ROUTINE W REFLEX MICROSCOPIC - Abnormal; Notable for the following:    Hgb urine dipstick LARGE (*)     All other components within normal limits  CBC WITH DIFFERENTIAL - Abnormal; Notable for the following:    RDW 11.3 (*)     All other components within normal limits  COMPREHENSIVE METABOLIC PANEL - Abnormal; Notable for the following:    Potassium 3.4 (*)     Glucose, Bld 121 (*)     GFR calc non Af Amer 86 (*)     All other components within normal limits  PREGNANCY, URINE  URINE MICROSCOPIC-ADD ON   US Transvaginal Non-ob  07/24/2012  *RADIOLOGY REPORT*  Clinical Data: Pelvic pain.  History of ovarian cyst. G3 P3.  LMP 06/25/2012.  Prior tubal ligation.  TRANSABDOMINAL AND TRANSVAGINAL ULTRASOUND OF PELVIS  Technique:  Both transabdominal and transvaginal ultrasound examinations of the pelvis were performed. Transabdominal technique was performed for global imaging of the pelvis including uterus, ovaries, adnexal regions, and pelvic cul-de-sac.  It was necessary to proceed with endovaginal exam following the transabdominal exam to visualize the endometrium and ovaries as the bladder was incompletely distended.  Comparison:   CT abdomen and pelvis 05/28/2012 and pelvic ultrasound 02/17/2012.  Findings:  Uterus: Normal in size with heterogeneous myometrial echotexture, measuring approximately 7.9 x 5.6 x 6.5 cm.  No focal myometrial abnormality.  Endometrium: Normal and trilaminar in appearance measuring approximately 11 mm in thickness.  Right ovary:  Normal in appearance containing small follicular cysts, including an approximate 1.8 cm simple cyst.  Ovary measures approximately 3.1 x 2.6 x 2.2 cm.  Normal color Doppler flow.  Left ovary: Normal in appearance containing small follicular cyst, measuring approximately 4.1 x 2.1 x 2.3 cm.  Normal color Doppler flow.  Other findings: No free pelvic fluid.  IMPRESSION: No significant abnormality.   Original Report Authenticated By: Hulan Saas, M.D.  US Pelvis Complete  07/24/2012  *RADIOLOGY REPORT*  Clinical Data: Pelvic pain.  History of ovarian cyst. G3 P3.  LMP 06/25/2012.  Prior tubal ligation.  TRANSABDOMINAL AND TRANSVAGINAL ULTRASOUND OF PELVIS  Technique:  Both transabdominal and transvaginal ultrasound examinations of the pelvis were performed. Transabdominal technique was performed for global imaging of the pelvis including uterus, ovaries, adnexal regions, and pelvic cul-de-sac.  It was necessary to proceed with endovaginal exam following the transabdominal exam to visualize the endometrium and ovaries as the bladder was incompletely distended.  Comparison:  CT abdomen and pelvis 05/28/2012 and pelvic ultrasound 02/17/2012.  Findings:  Uterus: Normal in size with heterogeneous myometrial echotexture, measuring approximately 7.9 x 5.6 x 6.5 cm.  No focal myometrial abnormality.  Endometrium: Normal and trilaminar in appearance measuring approximately 11 mm in thickness.  Right ovary:  Normal in appearance containing small follicular cysts, including an approximate 1.8 cm simple cyst.  Ovary measures approximately 3.1 x 2.6 x 2.2 cm.  Normal color Doppler flow.  Left  ovary: Normal in appearance containing small follicular cyst, measuring approximately 4.1 x 2.1 x 2.3 cm.  Normal color Doppler flow.  Other findings: No free pelvic fluid.  IMPRESSION: No significant abnormality.   Original Report Authenticated By: Hulan Saas, M.D.      1. Pelvic pain in female   2. Ovarian cyst       MDM   Patient with recurrence of her outputs pelvic pain that started today. She states she's had intermittent pain for the last one to 2 months was found to have a kidney stone but also had recently seen an OB/GYN and had an ultrasound done that showed ovarian cyst. She has a consultation in 2 weeks to evaluate if patient has endometriosis. However she states she abruptly developed severe pain in her pelvic area today. On bimanual exam she has significant pain over the left ovary. She denies fevers or vomiting but has complained of nausea. Patient did not have symptoms concerning for kidney stone at this time. Her urine has large amount of blood however this is not new. The biggest concern is for possible ruptured ovarian cyst. She has had a tubal ligation and her urine pregnancy is negative. She does have a history of HIV however there are no symptoms concerning for PID at this time. She has recently been checked for STDs and states that she is not concerned and has a low risk for infection. She does have a history of HIV but has normal counts. CBC, CMP are within normal limits. Pelvic ultrasound pending.  9:37 PM Labs wnl.  U/s without signs of acute ovarian cyst rupture.  Discussed with pt we could not send her home with pain meds as she recently has 30 dilaudids filled in the last 2 weeks and has filled 2 percocet ppx and tylenol #3 in the last month.  Encouraged her to f/u with PCP or gyn for pain control.      Gwyneth Sprout, MD 07/24/12 2138  Gwyneth Sprout, MD 07/24/12 2140

## 2012-07-24 NOTE — ED Notes (Signed)
MD at bedside. 

## 2012-07-24 NOTE — ED Notes (Signed)
Patient reports that she was seen a couple of weeks ago for lower abdominal and backpain, reports that the pain started again today at 4pm. Nausea w/o vomiting. Denies urinary symptoms

## 2012-07-24 NOTE — ED Notes (Signed)
Patient transported to Ultrasound 

## 2012-07-30 ENCOUNTER — Emergency Department (HOSPITAL_BASED_OUTPATIENT_CLINIC_OR_DEPARTMENT_OTHER)
Admission: EM | Admit: 2012-07-30 | Discharge: 2012-07-30 | Disposition: A | Discharge status: Home / Self Care | Payer: Medicaid Other | Attending: Emergency Medicine | Admitting: Emergency Medicine

## 2012-07-30 ENCOUNTER — Encounter (HOSPITAL_BASED_OUTPATIENT_CLINIC_OR_DEPARTMENT_OTHER): Payer: Self-pay | Admitting: Emergency Medicine

## 2012-07-30 DIAGNOSIS — Z8742 Personal history of other diseases of the female genital tract: Secondary | ICD-10-CM | POA: Insufficient documentation

## 2012-07-30 DIAGNOSIS — F17200 Nicotine dependence, unspecified, uncomplicated: Secondary | ICD-10-CM | POA: Insufficient documentation

## 2012-07-30 DIAGNOSIS — Z87442 Personal history of urinary calculi: Secondary | ICD-10-CM | POA: Insufficient documentation

## 2012-07-30 DIAGNOSIS — B20 Human immunodeficiency virus [HIV] disease: Secondary | ICD-10-CM | POA: Insufficient documentation

## 2012-07-30 DIAGNOSIS — Z79899 Other long term (current) drug therapy: Secondary | ICD-10-CM | POA: Insufficient documentation

## 2012-07-30 DIAGNOSIS — N949 Unspecified condition associated with female genital organs and menstrual cycle: Secondary | ICD-10-CM | POA: Insufficient documentation

## 2012-07-30 DIAGNOSIS — G40909 Epilepsy, unspecified, not intractable, without status epilepticus: Secondary | ICD-10-CM | POA: Insufficient documentation

## 2012-07-30 DIAGNOSIS — M26609 Unspecified temporomandibular joint disorder, unspecified side: Secondary | ICD-10-CM | POA: Insufficient documentation

## 2012-07-30 DIAGNOSIS — F411 Generalized anxiety disorder: Secondary | ICD-10-CM | POA: Insufficient documentation

## 2012-07-30 DIAGNOSIS — R102 Pelvic and perineal pain unspecified side: Secondary | ICD-10-CM

## 2012-07-30 DIAGNOSIS — R300 Dysuria: Secondary | ICD-10-CM | POA: Insufficient documentation

## 2012-07-30 DIAGNOSIS — R110 Nausea: Secondary | ICD-10-CM | POA: Insufficient documentation

## 2012-07-30 LAB — URINALYSIS, ROUTINE W REFLEX MICROSCOPIC
Bilirubin Urine: NEGATIVE
Hgb urine dipstick: NEGATIVE
Leukocytes, UA: NEGATIVE
Nitrite: NEGATIVE
Specific Gravity, Urine: 1.029 (ref 1.005–1.030)

## 2012-07-30 MED ORDER — HYDROMORPHONE HCL PF 1 MG/ML IJ SOLN
1.0000 mg | Freq: Once | INTRAMUSCULAR | Status: AC
  Administered 2012-07-30: 1 mg via INTRAMUSCULAR
  Filled 2012-07-30: qty 1

## 2012-07-30 MED ORDER — ONDANSETRON 4 MG PO TBDP
4.0000 mg | ORAL_TABLET | Freq: Once | ORAL | Status: AC
  Administered 2012-07-30: 4 mg via ORAL
  Filled 2012-07-30: qty 1

## 2012-07-30 NOTE — ED Provider Notes (Signed)
History  This chart was scribed for Rolan Bucco, MD by Ardeen Jourdain, ED Scribe. This patient was seen in room MH04/MH04 and the patient's care was started at 2049.  CSN: [id number]  Arrival date & time 07/30/12  1956   First MD Initiated Contact with Patient 07/30/12 2049      Chief Complaint  Patient presents with  . Abdominal Pain    The history is provided by the patient. No language interpreter was used.    Dawn Velasquez is a 26 y.o. female who presents to the Emergency Department complaining of lower abdominal pain that started about a year ago but has been gradually worsening over the past few months. She has associated nausea and trouble urinating. She denies emesis, vaginal bleeding, vaginal discharge and fever. She reports that she has been scheduled for a hysterectomy on 11/19, but that the pain was severe enough to come into the ED. She has a h/o HIV, ovarian cyst and TMJ. She is a current everyday smoker but denies alcohol use.   Past Medical History  Diagnosis Date  . HIV (human immunodeficiency virus infection)   . TMJ syndrome   . Simple seizure     r/t taking tramadol  . Ovarian cyst   . Kidney stone   . Anxiety     Past Surgical History  Procedure Date  . Appendectomy   . Cesarean section   . Ankle surgery   . Tubal ligation   . Fracture surgery   . Lithotripsy     No family history on file.  History  Substance Use Topics  . Smoking status: Current Every Day Smoker -- 0.5 packs/day    Types: Cigarettes  . Smokeless tobacco: Never Used  . Alcohol Use: No   No OB history available.   Review of Systems  Constitutional: Negative for fever, chills, diaphoresis and fatigue.  HENT: Negative for congestion, rhinorrhea and sneezing.   Eyes: Negative.   Respiratory: Negative for cough, chest tightness and shortness of breath.   Cardiovascular: Negative for chest pain and leg swelling.  Gastrointestinal: Positive for nausea and abdominal  pain. Negative for vomiting, diarrhea and blood in stool.  Genitourinary: Positive for difficulty urinating. Negative for frequency, hematuria and flank pain.  Musculoskeletal: Negative for back pain and arthralgias.  Skin: Negative for rash.  Neurological: Negative for dizziness, speech difficulty, weakness, numbness and headaches.  All other systems reviewed and are negative.    Allergies  Penicillins; Naproxen; and Tramadol  Home Medications   Current Outpatient Rx  Name  Route  Sig  Dispense  Refill  . ALPRAZOLAM 0.5 MG PO TABS   Oral   Take 0.5 mg by mouth 2 (two) times daily.          Marland Kitchen EMTRICITABINE-TENOFOVIR 200-300 MG PO TABS   Oral   Take 1 tablet by mouth 2 (two) times daily. HIV medication.  Consult needed.         Marland Kitchen FOSAMPRENAVIR CALCIUM 700 MG PO TABS   Oral   Take 1,400 mg by mouth 2 (two) times daily. HIV medication.  Consult needed.         Marland Kitchen HYDROMORPHONE HCL 2 MG PO TABS   Oral   Take 1 tablet (2 mg total) by mouth every 6 (six) hours as needed for pain.   30 tablet   0   . RITONAVIR 100 MG PO CAPS   Oral   Take 100 mg by mouth 2 (two) times daily. HIV medication.  Consult needed.           Triage Vitals: BP 113/77  Pulse 117  Temp 98 F (36.7 C) (Oral)  Resp 20  SpO2 94%  Physical Exam  Constitutional: She is oriented to person, place, and time. She appears well-developed and well-nourished.  HENT:  Head: Normocephalic and atraumatic.  Eyes: Pupils are equal, round, and reactive to light.  Neck: Normal range of motion. Neck supple.  Cardiovascular: Normal rate, regular rhythm and normal heart sounds.   Pulmonary/Chest: Effort normal and breath sounds normal. No respiratory distress. She has no wheezes. She has no rales. She exhibits no tenderness.  Abdominal: Soft. Bowel sounds are normal. There is no tenderness. There is no rebound and no guarding.       Moderate tenderness to suprapubic area and lower abdomen   Musculoskeletal:  Normal range of motion. She exhibits no edema.  Lymphadenopathy:    She has no cervical adenopathy.  Neurological: She is alert and oriented to person, place, and time.  Skin: Skin is warm and dry. No rash noted.  Psychiatric: She has a normal mood and affect.    ED Course  Procedures (including critical care time)  DIAGNOSTIC STUDIES: Oxygen Saturation is 94% on room air, adequate by my interpretation.    COORDINATION OF CARE:  8:53 PM: Discussed treatment plan which includes pain medicatiobn with pt at bedside and pt agreed to plan.  Results for orders placed during the hospital encounter of 07/30/12  PREGNANCY, URINE      Component Value Range   Preg Test, Ur NEGATIVE  NEGATIVE  URINALYSIS, ROUTINE W REFLEX MICROSCOPIC      Component Value Range   Color, Urine YELLOW  YELLOW   APPearance CLOUDY (*) CLEAR   Specific Gravity, Urine 1.029  1.005 - 1.030   pH 6.0  5.0 - 8.0   Glucose, UA NEGATIVE  NEGATIVE mg/dL   Hgb urine dipstick NEGATIVE  NEGATIVE   Bilirubin Urine NEGATIVE  NEGATIVE   Ketones, ur NEGATIVE  NEGATIVE mg/dL   Protein, ur NEGATIVE  NEGATIVE mg/dL   Urobilinogen, UA 1.0  0.0 - 1.0 mg/dL   Nitrite NEGATIVE  NEGATIVE   Leukocytes, UA NEGATIVE  NEGATIVE   US Transvaginal Non-ob  07/24/2012  *RADIOLOGY REPORT*  Clinical Data: Pelvic pain.  History of ovarian cyst. G3 P3.  LMP 06/25/2012.  Prior tubal ligation.  TRANSABDOMINAL AND TRANSVAGINAL ULTRASOUND OF PELVIS  Technique:  Both transabdominal and transvaginal ultrasound examinations of the pelvis were performed. Transabdominal technique was performed for global imaging of the pelvis including uterus, ovaries, adnexal regions, and pelvic cul-de-sac.  It was necessary to proceed with endovaginal exam following the transabdominal exam to visualize the endometrium and ovaries as the bladder was incompletely distended.  Comparison:  CT abdomen and pelvis 05/28/2012 and pelvic ultrasound 02/17/2012.  Findings:  Uterus:  Normal in size with heterogeneous myometrial echotexture, measuring approximately 7.9 x 5.6 x 6.5 cm.  No focal myometrial abnormality.  Endometrium: Normal and trilaminar in appearance measuring approximately 11 mm in thickness.  Right ovary:  Normal in appearance containing small follicular cysts, including an approximate 1.8 cm simple cyst.  Ovary measures approximately 3.1 x 2.6 x 2.2 cm.  Normal color Doppler flow.  Left ovary: Normal in appearance containing small follicular cyst, measuring approximately 4.1 x 2.1 x 2.3 cm.  Normal color Doppler flow.  Other findings: No free pelvic fluid.  IMPRESSION: No significant abnormality.   Original Report Authenticated By: Hulan Saas,  M.D.    US Pelvis Complete  07/24/2012  *RADIOLOGY REPORT*  Clinical Data: Pelvic pain.  History of ovarian cyst. G3 P3.  LMP 06/25/2012.  Prior tubal ligation.  TRANSABDOMINAL AND TRANSVAGINAL ULTRASOUND OF PELVIS  Technique:  Both transabdominal and transvaginal ultrasound examinations of the pelvis were performed. Transabdominal technique was performed for global imaging of the pelvis including uterus, ovaries, adnexal regions, and pelvic cul-de-sac.  It was necessary to proceed with endovaginal exam following the transabdominal exam to visualize the endometrium and ovaries as the bladder was incompletely distended.  Comparison:  CT abdomen and pelvis 05/28/2012 and pelvic ultrasound 02/17/2012.  Findings:  Uterus: Normal in size with heterogeneous myometrial echotexture, measuring approximately 7.9 x 5.6 x 6.5 cm.  No focal myometrial abnormality.  Endometrium: Normal and trilaminar in appearance measuring approximately 11 mm in thickness.  Right ovary:  Normal in appearance containing small follicular cysts, including an approximate 1.8 cm simple cyst.  Ovary measures approximately 3.1 x 2.6 x 2.2 cm.  Normal color Doppler flow.  Left ovary: Normal in appearance containing small follicular cyst, measuring approximately 4.1 x  2.1 x 2.3 cm.  Normal color Doppler flow.  Other findings: No free pelvic fluid.  IMPRESSION: No significant abnormality.   Original Report Authenticated By: Hulan Saas, M.D.       1. Pelvic pain in female       MDM  Pt with chronic appearing pelvic pain, similar to past visits.  Was here a few days ago for same symptoms, had pelvic exam and pelvic ultrasound.  Is scheduled to have a hysterectomy on Nov 19th.  Has had multiple narcotic rx in recent past.  Was given shot of dilaudid here.  Advised to f/u with her ob/gyn for further pain management      . I personally performed the services described in this documentation, which was scribed in my presence.  The recorded information has been reviewed and considered.    Rolan Bucco, MD 07/31/12 [phone]

## 2012-07-30 NOTE — ED Notes (Signed)
Pt c/o abd pain, pt is scheduled for hysterectomy 11/19 per pt.

## 2012-07-30 NOTE — ED Notes (Signed)
Pt having abd pain,she sts d/t needing surgery, rate pain 8/10

## 2012-08-15 ENCOUNTER — Emergency Department (HOSPITAL_BASED_OUTPATIENT_CLINIC_OR_DEPARTMENT_OTHER)
Admission: EM | Admit: 2012-08-15 | Discharge: 2012-08-15 | Disposition: A | Discharge status: Home / Self Care | Payer: Medicaid Other | Attending: Emergency Medicine | Admitting: Emergency Medicine

## 2012-08-15 ENCOUNTER — Encounter (HOSPITAL_BASED_OUTPATIENT_CLINIC_OR_DEPARTMENT_OTHER): Payer: Self-pay | Admitting: *Deleted

## 2012-08-15 DIAGNOSIS — Z79899 Other long term (current) drug therapy: Secondary | ICD-10-CM | POA: Insufficient documentation

## 2012-08-15 DIAGNOSIS — Z4801 Encounter for change or removal of surgical wound dressing: Secondary | ICD-10-CM | POA: Insufficient documentation

## 2012-08-15 DIAGNOSIS — Z8742 Personal history of other diseases of the female genital tract: Secondary | ICD-10-CM | POA: Insufficient documentation

## 2012-08-15 DIAGNOSIS — M26609 Unspecified temporomandibular joint disorder, unspecified side: Secondary | ICD-10-CM | POA: Insufficient documentation

## 2012-08-15 DIAGNOSIS — F411 Generalized anxiety disorder: Secondary | ICD-10-CM | POA: Insufficient documentation

## 2012-08-15 DIAGNOSIS — T8189XA Other complications of procedures, not elsewhere classified, initial encounter: Secondary | ICD-10-CM

## 2012-08-15 DIAGNOSIS — Z87442 Personal history of urinary calculi: Secondary | ICD-10-CM | POA: Insufficient documentation

## 2012-08-15 DIAGNOSIS — F17200 Nicotine dependence, unspecified, uncomplicated: Secondary | ICD-10-CM | POA: Insufficient documentation

## 2012-08-15 DIAGNOSIS — B20 Human immunodeficiency virus [HIV] disease: Secondary | ICD-10-CM | POA: Insufficient documentation

## 2012-08-15 DIAGNOSIS — G40909 Epilepsy, unspecified, not intractable, without status epilepticus: Secondary | ICD-10-CM | POA: Insufficient documentation

## 2012-08-15 NOTE — ED Notes (Signed)
Pt told security on arrival that her husband dropped her off,  On security camera pt drove self

## 2012-08-15 NOTE — ED Provider Notes (Signed)
Medical screening examination/treatment/procedure(s) were performed by non-physician practitioner and as supervising physician I was immediately available for consultation/collaboration.  Geoffery Lyons, MD 08/15/12 2041

## 2012-08-15 NOTE — ED Notes (Signed)
Pt c/o pain at incision site s/p hysterectomy 11-19 by Dr Allena Katz. Pt states her MD is out of town so she couldn't follow up with him. Pt states that ibuprofen is not helping with her pain.

## 2012-08-15 NOTE — ED Notes (Signed)
MD at bedside. 

## 2012-08-15 NOTE — ED Provider Notes (Signed)
History     CSN: [id number]  Arrival date & time 08/15/12  4098   First MD Initiated Contact with Patient 08/15/12 1918      Chief Complaint  Patient presents with  . Wound Check    (Consider location/radiation/quality/duration/timing/severity/associated sxs/prior treatment) HPI Comments: Pt states that she had a hysterectomy on 11-19 and had the staples removed a couple of days ago and had steri strips placed:pt states that she had some steri strips fall off and she is having pain at the site  Patient is a 26 y.o. female presenting with wound check. The history is provided by the patient. No language interpreter was used.  Wound Check  She was treated in the ED 5 to 10 days ago. There has been no treatment since the wound repair. Her temperature was unmeasured prior to arrival. There has been no drainage from the wound. The redness has improved. There is no swelling present. The pain has not changed.    Past Medical History  Diagnosis Date  . HIV (human immunodeficiency virus infection)   . TMJ syndrome   . Simple seizure     r/t taking tramadol  . Ovarian cyst   . Kidney stone   . Anxiety     Past Surgical History  Procedure Date  . Appendectomy   . Cesarean section   . Ankle surgery   . Tubal ligation   . Fracture surgery   . Lithotripsy   . Abdominal hysterectomy     History reviewed. No pertinent family history.  History  Substance Use Topics  . Smoking status: Current Every Day Smoker -- 0.5 packs/day    Types: Cigarettes  . Smokeless tobacco: Never Used  . Alcohol Use: No    OB History    Grav Para Term Preterm Abortions TAB SAB Ect Mult Living                  Review of Systems  Constitutional: Negative.   Respiratory: Negative.   Cardiovascular: Negative.     Allergies  Penicillins; Naproxen; and Tramadol  Home Medications   Current Outpatient Rx  Name  Route  Sig  Dispense  Refill  . ALPRAZOLAM 0.5 MG PO TABS   Oral   Take 0.5 mg  by mouth 2 (two) times daily.          Marland Kitchen EMTRICITABINE-TENOFOVIR 200-300 MG PO TABS   Oral   Take 1 tablet by mouth 2 (two) times daily. HIV medication.  Consult needed.         Marland Kitchen FOSAMPRENAVIR CALCIUM 700 MG PO TABS   Oral   Take 1,400 mg by mouth 2 (two) times daily. HIV medication.  Consult needed.         Marland Kitchen HYDROMORPHONE HCL 2 MG PO TABS   Oral   Take 1 tablet (2 mg total) by mouth every 6 (six) hours as needed for pain.   30 tablet   0   . RITONAVIR 100 MG PO CAPS   Oral   Take 100 mg by mouth 2 (two) times daily. HIV medication.  Consult needed.           BP 120/51  Pulse 89  Temp 98.7 F (37.1 C) (Oral)  Resp 20  Ht 5\' 5"  (1.651 m)  Wt 147 lb (66.679 kg)  BMI 24.46 kg/m2  SpO2 98%  LMP 06/25/2012  Physical Exam  Nursing note and vitals reviewed. Cardiovascular: Normal rate and regular rhythm.   Abdominal: Soft.  There is no tenderness.  Skin:       Pt has surgical scar that is healing with mild redness and no drainage to the area    ED Course  Procedures (including critical care time)  Labs Reviewed - No data to display No results found.   1. Surgical wound, non healing       MDM  Steri strips replaced by the nurse:no drainage to the area:discussed with pt that she needed to follow up with her obgyn for pain control        Teressa Lower, NP 08/15/12 2009

## 2012-08-15 NOTE — ED Notes (Signed)
Security noted in pts room

## 2012-09-18 ENCOUNTER — Encounter (HOSPITAL_COMMUNITY): Payer: Self-pay | Admitting: *Deleted

## 2012-09-18 ENCOUNTER — Emergency Department (HOSPITAL_COMMUNITY)
Admission: EM | Admit: 2012-09-18 | Discharge: 2012-09-18 | Disposition: A | Discharge status: Home / Self Care | Payer: Medicaid Other | Attending: Emergency Medicine | Admitting: Emergency Medicine

## 2012-09-18 DIAGNOSIS — Z8739 Personal history of other diseases of the musculoskeletal system and connective tissue: Secondary | ICD-10-CM | POA: Insufficient documentation

## 2012-09-18 DIAGNOSIS — G40909 Epilepsy, unspecified, not intractable, without status epilepticus: Secondary | ICD-10-CM | POA: Insufficient documentation

## 2012-09-18 DIAGNOSIS — F17200 Nicotine dependence, unspecified, uncomplicated: Secondary | ICD-10-CM | POA: Insufficient documentation

## 2012-09-18 DIAGNOSIS — Z87442 Personal history of urinary calculi: Secondary | ICD-10-CM | POA: Insufficient documentation

## 2012-09-18 DIAGNOSIS — Z8742 Personal history of other diseases of the female genital tract: Secondary | ICD-10-CM | POA: Insufficient documentation

## 2012-09-18 DIAGNOSIS — F411 Generalized anxiety disorder: Secondary | ICD-10-CM | POA: Insufficient documentation

## 2012-09-18 DIAGNOSIS — B20 Human immunodeficiency virus [HIV] disease: Secondary | ICD-10-CM | POA: Insufficient documentation

## 2012-09-18 DIAGNOSIS — Z79899 Other long term (current) drug therapy: Secondary | ICD-10-CM | POA: Insufficient documentation

## 2012-09-18 DIAGNOSIS — K0889 Other specified disorders of teeth and supporting structures: Secondary | ICD-10-CM

## 2012-09-18 DIAGNOSIS — K089 Disorder of teeth and supporting structures, unspecified: Secondary | ICD-10-CM | POA: Insufficient documentation

## 2012-09-18 MED ORDER — HYDROCODONE-ACETAMINOPHEN 5-325 MG PO TABS: 1.0000 | ORAL_TABLET | Freq: Four times a day (QID) | ORAL | Status: DC | PRN

## 2012-09-18 MED ORDER — HYDROCODONE-ACETAMINOPHEN 5-325 MG PO TABS
1.0000 | ORAL_TABLET | Freq: Once | ORAL | Status: AC
  Administered 2012-09-18: 1 via ORAL
  Filled 2012-09-18 (×2): qty 1

## 2012-09-18 NOTE — ED Notes (Signed)
Pt bit into a candy cane 1 hr prior to arrival and is experiencing pain to upper lt back of jaw

## 2012-09-18 NOTE — ED Provider Notes (Signed)
History     CSN: [id number]  Arrival date & time 09/18/12  1958   First MD Initiated Contact with Patient 09/18/12 2010      Chief Complaint  Patient presents with  . Dental Pain    lt upper back side    (Consider location/radiation/quality/duration/timing/severity/associated sxs/prior treatment) Patient is a 26 y.o. female presenting with tooth pain. The history is provided by the patient (the pt complains of a toothache.  she hurt her tooth on a hard peice of canty). No language interpreter was used.  Dental PainThe primary symptoms include mouth pain. Primary symptoms do not include headaches or cough. The symptoms began 2 to 6 hours ago. The symptoms are unchanged. The symptoms are new. The symptoms occur constantly.  Additional symptoms do not include: gum swelling and fatigue.    Past Medical History  Diagnosis Date  . HIV (human immunodeficiency virus infection)   . TMJ syndrome   . Simple seizure     r/t taking tramadol  . Ovarian cyst   . Kidney stone   . Anxiety     Past Surgical History  Procedure Date  . Appendectomy   . Cesarean section   . Ankle surgery   . Tubal ligation   . Fracture surgery   . Lithotripsy   . Abdominal hysterectomy     History reviewed. No pertinent family history.  History  Substance Use Topics  . Smoking status: Current Every Day Smoker -- 0.5 packs/day    Types: Cigarettes  . Smokeless tobacco: Never Used  . Alcohol Use: No    OB History    Grav Para Term Preterm Abortions TAB SAB Ect Mult Living                  Review of Systems  Constitutional: Negative for fatigue.  HENT: Negative for congestion, sinus pressure and ear discharge.        Toothache  Eyes: Negative for discharge.  Respiratory: Negative for cough.   Cardiovascular: Negative for chest pain.  Gastrointestinal: Negative for abdominal pain and diarrhea.  Genitourinary: Negative for frequency and hematuria.  Musculoskeletal: Negative for back pain.    Skin: Negative for rash.  Neurological: Negative for seizures and headaches.  Hematological: Negative.   Psychiatric/Behavioral: Negative for hallucinations.    Allergies  Penicillins; Naproxen; and Tramadol  Home Medications   Current Outpatient Rx  Name  Route  Sig  Dispense  Refill  . ALPRAZOLAM 0.5 MG PO TABS   Oral   Take 0.5 mg by mouth 2 (two) times daily.          Marland Kitchen EMTRICITABINE-TENOFOVIR 200-300 MG PO TABS   Oral   Take 1 tablet by mouth 2 (two) times daily. HIV medication.  Consult needed.         Marland Kitchen FOSAMPRENAVIR CALCIUM 700 MG PO TABS   Oral   Take 1,400 mg by mouth 2 (two) times daily. HIV medication.  Consult needed.         Marland Kitchen HYDROCODONE-ACETAMINOPHEN 5-325 MG PO TABS   Oral   Take 1 tablet by mouth every 6 (six) hours as needed for pain.   30 tablet   0   . HYDROMORPHONE HCL 2 MG PO TABS   Oral   Take 1 tablet (2 mg total) by mouth every 6 (six) hours as needed for pain.   30 tablet   0   . RITONAVIR 100 MG PO CAPS   Oral   Take 100 mg  by mouth 2 (two) times daily. HIV medication.  Consult needed.           LMP 06/25/2012  Physical Exam  Constitutional: She is oriented to person, place, and time. She appears well-developed.  HENT:  Head: Normocephalic and atraumatic.       Tender left upper tooth  Eyes: Conjunctivae normal and EOM are normal. No scleral icterus.  Neck: Neck supple. No tracheal deviation present. No thyromegaly present.  Cardiovascular: Normal rate and regular rhythm.  Exam reveals no gallop and no friction rub.   No murmur heard. Pulmonary/Chest: No stridor. She has no wheezes. She has no rales. She exhibits no tenderness.  Abdominal: She exhibits no distension. There is no tenderness. There is no rebound.  Musculoskeletal: Normal range of motion. She exhibits no edema.  Lymphadenopathy:    She has no cervical adenopathy.  Neurological: She is oriented to person, place, and time. Coordination normal.  Skin: Skin is  warm. No rash noted. No erythema.  Psychiatric: She has a normal mood and affect. Her behavior is normal.    ED Course  Procedures (including critical care time)  Labs Reviewed - No data to display No results found.   1. Toothache       MDM          Benny Lennert, MD 09/18/12 2020

## 2012-09-18 NOTE — ED Notes (Signed)
Pt is HIV positive, states her counts are good at the moment

## 2012-09-29 ENCOUNTER — Encounter (HOSPITAL_BASED_OUTPATIENT_CLINIC_OR_DEPARTMENT_OTHER): Payer: Self-pay | Admitting: *Deleted

## 2012-09-29 ENCOUNTER — Emergency Department (HOSPITAL_BASED_OUTPATIENT_CLINIC_OR_DEPARTMENT_OTHER)
Admission: EM | Admit: 2012-09-29 | Discharge: 2012-09-29 | Disposition: A | Discharge status: Home / Self Care | Payer: Medicaid Other | Attending: Emergency Medicine | Admitting: Emergency Medicine

## 2012-09-29 DIAGNOSIS — Z9851 Tubal ligation status: Secondary | ICD-10-CM | POA: Insufficient documentation

## 2012-09-29 DIAGNOSIS — Z87442 Personal history of urinary calculi: Secondary | ICD-10-CM | POA: Insufficient documentation

## 2012-09-29 DIAGNOSIS — Z8739 Personal history of other diseases of the musculoskeletal system and connective tissue: Secondary | ICD-10-CM | POA: Insufficient documentation

## 2012-09-29 DIAGNOSIS — N23 Unspecified renal colic: Secondary | ICD-10-CM

## 2012-09-29 DIAGNOSIS — F17200 Nicotine dependence, unspecified, uncomplicated: Secondary | ICD-10-CM | POA: Insufficient documentation

## 2012-09-29 DIAGNOSIS — F411 Generalized anxiety disorder: Secondary | ICD-10-CM | POA: Insufficient documentation

## 2012-09-29 DIAGNOSIS — Z21 Asymptomatic human immunodeficiency virus [HIV] infection status: Secondary | ICD-10-CM | POA: Insufficient documentation

## 2012-09-29 DIAGNOSIS — Z79899 Other long term (current) drug therapy: Secondary | ICD-10-CM | POA: Insufficient documentation

## 2012-09-29 DIAGNOSIS — Z8742 Personal history of other diseases of the female genital tract: Secondary | ICD-10-CM | POA: Insufficient documentation

## 2012-09-29 DIAGNOSIS — Z90710 Acquired absence of both cervix and uterus: Secondary | ICD-10-CM | POA: Insufficient documentation

## 2012-09-29 LAB — URINALYSIS, ROUTINE W REFLEX MICROSCOPIC
Bilirubin Urine: NEGATIVE
Ketones, ur: NEGATIVE mg/dL
Leukocytes, UA: NEGATIVE
Nitrite: NEGATIVE
Specific Gravity, Urine: 1.028 (ref 1.005–1.030)
Urobilinogen, UA: 1 mg/dL (ref 0.0–1.0)
pH: 6 (ref 5.0–8.0)

## 2012-09-29 LAB — URINE MICROSCOPIC-ADD ON

## 2012-09-29 MED ORDER — OXYCODONE-ACETAMINOPHEN 5-325 MG PO TABS: 1.0000 | ORAL_TABLET | Freq: Four times a day (QID) | ORAL | Status: DC | PRN

## 2012-09-29 MED ORDER — TAMSULOSIN HCL 0.4 MG PO CAPS: 0.4000 mg | ORAL_CAPSULE | Freq: Once | ORAL | Status: DC

## 2012-09-29 MED ORDER — TAMSULOSIN HCL 0.4 MG PO CAPS: ORAL_CAPSULE | ORAL | Status: DC

## 2012-09-29 MED ORDER — FENTANYL CITRATE 0.05 MG/ML IJ SOLN: 100.0000 ug | Freq: Once | INTRAMUSCULAR | Status: DC

## 2012-09-29 MED ORDER — FENTANYL CITRATE 0.05 MG/ML IJ SOLN
INTRAMUSCULAR | Status: AC
  Administered 2012-09-29: 100 ug via INTRAVENOUS
  Filled 2012-09-29: qty 2

## 2012-09-29 MED ORDER — ONDANSETRON HCL 4 MG/2ML IJ SOLN
INTRAMUSCULAR | Status: AC
  Administered 2012-09-29: 4 mg via INTRAVENOUS
  Filled 2012-09-29: qty 2

## 2012-09-29 NOTE — ED Notes (Signed)
Labs drawn and held for orders

## 2012-09-29 NOTE — ED Notes (Addendum)
Pt c/o left flank pain and hematuria onset a couple of hours ago. Hx stones. Pt talking on phone in lobby when called. Moaning in triage.

## 2012-09-29 NOTE — ED Provider Notes (Signed)
History  This chart was scribed for Hanley Seamen, MD by Shari Heritage, ED Scribe. The patient was seen in room MH07/MH07. Patient's care was started at 2303.  CSN: [id number]  Arrival date & time 09/29/12  2133   First MD Initiated Contact with Patient 09/29/12 2303      Chief Complaint  Patient presents with  . Flank Pain     The history is provided by the patient. No language interpreter was used.    HPI Comments: Dawn Velasquez is a 27 y.o. female who presents to the Emergency Department complaining of moderate, constant, non-radiating left flank pain onset this morning. Patient says that pain has gradually worsened throughout the day. There is associated hematuria, nausea and difficulty voiding. Patient denies fever, chills, or vomiting. Patient says that this pain feels exactly like past pain associated with kidney stones. Patient says that at most severe, pain is 8/10. She says that it is now 6/10 after taking some pain medicines from a past prescription prior to arrival. Patient has a history of HIV.    Past Medical History  Diagnosis Date  . HIV (human immunodeficiency virus infection)   . TMJ syndrome   . Simple seizure     r/t taking tramadol  . Ovarian cyst   . Kidney stone   . Anxiety     Past Surgical History  Procedure Date  . Appendectomy   . Cesarean section   . Ankle surgery   . Tubal ligation   . Fracture surgery   . Lithotripsy   . Abdominal hysterectomy     History reviewed. No pertinent family history.  History  Substance Use Topics  . Smoking status: Current Every Day Smoker -- 0.5 packs/day    Types: Cigarettes  . Smokeless tobacco: Never Used  . Alcohol Use: No    OB History    Grav Para Term Preterm Abortions TAB SAB Ect Mult Living                  Review of Systems A complete 10 system review of systems was obtained and all systems are negative except as noted in the HPI and PMH.   Allergies  Penicillins; Naproxen; and  Tramadol  Home Medications   Current Outpatient Rx  Name  Route  Sig  Dispense  Refill  . ALPRAZOLAM 0.5 MG PO TABS   Oral   Take 0.5 mg by mouth 2 (two) times daily.          Marlin Canary HEADACHE PO   Oral   Take 1 packet by mouth daily as needed. For pain         . PREZISTA PO   Oral   Take 1 tablet by mouth daily.         Marland Kitchen EMTRICITABINE-TENOFOVIR 200-300 MG PO TABS   Oral   Take 1 tablet by mouth 2 (two) times daily. HIV medication.  Consult needed.         Marland Kitchen HYDROCODONE-ACETAMINOPHEN 5-325 MG PO TABS   Oral   Take 1 tablet by mouth every 6 (six) hours as needed for pain.   30 tablet   0   . RITONAVIR 100 MG PO CAPS   Oral   Take 100 mg by mouth 2 (two) times daily. HIV medication.  Consult needed.           Triage Vitals: BP 114/82  Pulse 110  Temp 98.1 F (36.7 C) (Oral)  Resp 20  Ht 5'  5" (1.651 m)  Wt 150 lb (68.04 kg)  BMI 24.96 kg/m2  SpO2 96%  LMP 06/25/2012  Physical Exam  Constitutional: She is oriented to person, place, and time. She appears well-developed and well-nourished. No distress.  HENT:  Head: Normocephalic and atraumatic.  Eyes: EOM are normal. Pupils are equal, round, and reactive to light.  Neck: Neck supple.  Cardiovascular: Normal rate and regular rhythm.   Pulmonary/Chest: Effort normal and breath sounds normal.  Abdominal: Soft. Bowel sounds are normal. She exhibits no distension. There is no tenderness. There is CVA tenderness (left). There is no rebound and no guarding.  Musculoskeletal: Normal range of motion. She exhibits no edema.       Normal pulses. No edema.  Neurological: She is alert and oriented to person, place, and time.  Skin: Skin is warm and dry.  Psychiatric: She has a normal mood and affect. Her behavior is normal.    ED Course  Procedures (including critical care time) DIAGNOSTIC STUDIES: Oxygen Saturation is 96% on room air, adequate by my interpretation.    COORDINATION OF CARE: 11:08 PM-  Patient informed of current plan for treatment and evaluation and agrees with plan at this time.     MDM   Nursing notes and vitals signs, including pulse oximetry, reviewed.  Summary of this visit's results, reviewed by myself:  Labs:  Results for orders placed during the hospital encounter of 09/29/12 (from the past 24 hour(s))  URINALYSIS, ROUTINE W REFLEX MICROSCOPIC     Status: Abnormal   Collection Time   09/29/12  9:42 PM      Component Value Range   Color, Urine YELLOW  YELLOW   APPearance CLEAR  CLEAR   Specific Gravity, Urine 1.028  1.005 - 1.030   pH 6.0  5.0 - 8.0   Glucose, UA NEGATIVE  NEGATIVE mg/dL   Hgb urine dipstick LARGE (*) NEGATIVE   Bilirubin Urine NEGATIVE  NEGATIVE   Ketones, ur NEGATIVE  NEGATIVE mg/dL   Protein, ur NEGATIVE  NEGATIVE mg/dL   Urobilinogen, UA 1.0  0.0 - 1.0 mg/dL   Nitrite NEGATIVE  NEGATIVE   Leukocytes, UA NEGATIVE  NEGATIVE  URINE MICROSCOPIC-ADD ON     Status: Abnormal   Collection Time   09/29/12  9:42 PM      Component Value Range   Squamous Epithelial / LPF FEW (*) RARE   WBC, UA 0-2  <3 WBC/hpf   RBC / HPF TOO NUMEROUS TO COUNT  <3 RBC/hpf   Bacteria, UA FEW (*) RARE    11:15 PM Voice CT scan at this time as patient has had numerous CT scans in the past. She has a known history of kidney stones we will treat presumptively.     I personally performed the services described in this documentation, which was scribed in my presence.  The recorded information has been reviewed and accurate.    Hanley Seamen, MD 09/29/12 [phone]

## 2012-10-04 ENCOUNTER — Emergency Department (HOSPITAL_BASED_OUTPATIENT_CLINIC_OR_DEPARTMENT_OTHER): Payer: Medicaid Other

## 2012-10-04 ENCOUNTER — Emergency Department (HOSPITAL_BASED_OUTPATIENT_CLINIC_OR_DEPARTMENT_OTHER)
Admission: EM | Admit: 2012-10-04 | Discharge: 2012-10-04 | Discharge status: Against Medical Advice | Payer: Medicaid Other | Source: Home / Self Care | Attending: Emergency Medicine | Admitting: Emergency Medicine

## 2012-10-04 ENCOUNTER — Encounter (HOSPITAL_BASED_OUTPATIENT_CLINIC_OR_DEPARTMENT_OTHER): Payer: Self-pay | Admitting: *Deleted

## 2012-10-04 ENCOUNTER — Encounter (HOSPITAL_BASED_OUTPATIENT_CLINIC_OR_DEPARTMENT_OTHER): Payer: Self-pay | Admitting: Emergency Medicine

## 2012-10-04 ENCOUNTER — Emergency Department (HOSPITAL_BASED_OUTPATIENT_CLINIC_OR_DEPARTMENT_OTHER)
Admission: EM | Admit: 2012-10-04 | Discharge: 2012-10-04 | Disposition: A | Discharge status: Other Acute Inpatient Hospital | Payer: Medicaid Other | Attending: Emergency Medicine | Admitting: Emergency Medicine

## 2012-10-04 DIAGNOSIS — J40 Bronchitis, not specified as acute or chronic: Secondary | ICD-10-CM

## 2012-10-04 DIAGNOSIS — F411 Generalized anxiety disorder: Secondary | ICD-10-CM | POA: Insufficient documentation

## 2012-10-04 DIAGNOSIS — Z87442 Personal history of urinary calculi: Secondary | ICD-10-CM | POA: Insufficient documentation

## 2012-10-04 DIAGNOSIS — R05 Cough: Secondary | ICD-10-CM | POA: Insufficient documentation

## 2012-10-04 DIAGNOSIS — Z21 Asymptomatic human immunodeficiency virus [HIV] infection status: Secondary | ICD-10-CM | POA: Insufficient documentation

## 2012-10-04 DIAGNOSIS — K668 Other specified disorders of peritoneum: Secondary | ICD-10-CM | POA: Insufficient documentation

## 2012-10-04 DIAGNOSIS — B20 Human immunodeficiency virus [HIV] disease: Secondary | ICD-10-CM

## 2012-10-04 DIAGNOSIS — R059 Cough, unspecified: Secondary | ICD-10-CM | POA: Insufficient documentation

## 2012-10-04 DIAGNOSIS — F17200 Nicotine dependence, unspecified, uncomplicated: Secondary | ICD-10-CM | POA: Insufficient documentation

## 2012-10-04 DIAGNOSIS — Z8719 Personal history of other diseases of the digestive system: Secondary | ICD-10-CM | POA: Insufficient documentation

## 2012-10-04 DIAGNOSIS — Z90710 Acquired absence of both cervix and uterus: Secondary | ICD-10-CM | POA: Insufficient documentation

## 2012-10-04 DIAGNOSIS — Z8742 Personal history of other diseases of the female genital tract: Secondary | ICD-10-CM | POA: Insufficient documentation

## 2012-10-04 DIAGNOSIS — Z79899 Other long term (current) drug therapy: Secondary | ICD-10-CM | POA: Insufficient documentation

## 2012-10-04 LAB — BASIC METABOLIC PANEL
Chloride: 103 mEq/L (ref 96–112)
GFR calc Af Amer: 88 mL/min — ABNORMAL LOW (ref >90)
GFR calc non Af Amer: 76 mL/min — ABNORMAL LOW (ref >90)
Glucose, Bld: 92 mg/dL (ref 70–99)
Potassium: 3.7 mEq/L (ref 3.5–5.1)
Sodium: 137 mEq/L (ref 135–145)

## 2012-10-04 LAB — CBC WITH DIFFERENTIAL/PLATELET
Basophils Relative: 1 % (ref 0–1)
Eosinophils Absolute: 0.2 10*3/uL (ref 0.0–0.7)
Hemoglobin: 13.2 g/dL (ref 12.0–15.0)
Lymphocytes Relative: 34 % (ref 12–46)
MCHC: 34.6 g/dL (ref 30.0–36.0)
Monocytes Relative: 11 % (ref 3–12)
Neutrophils Relative %: 49 % (ref 43–77)
RBC: 4.35 MIL/uL (ref 3.87–5.11)
WBC: 3.3 10*3/uL — ABNORMAL LOW (ref 4.0–10.5)

## 2012-10-04 LAB — HEPATIC FUNCTION PANEL
Albumin: 3.9 g/dL (ref 3.5–5.2)
Alkaline Phosphatase: 53 U/L (ref 39–117)
Total Protein: 7.4 g/dL (ref 6.0–8.3)

## 2012-10-04 MED ORDER — ONDANSETRON HCL 4 MG/2ML IJ SOLN
4.0000 mg | Freq: Once | INTRAMUSCULAR | Status: AC
  Administered 2012-10-04: 4 mg via INTRAVENOUS
  Filled 2012-10-04: qty 2

## 2012-10-04 MED ORDER — SODIUM CHLORIDE 0.9 % IV BOLUS (SEPSIS)
1000.0000 mL | Freq: Once | INTRAVENOUS | Status: AC
  Administered 2012-10-04: 1000 mL via INTRAVENOUS

## 2012-10-04 MED ORDER — HYDROMORPHONE HCL PF 1 MG/ML IJ SOLN
1.0000 mg | Freq: Once | INTRAMUSCULAR | Status: AC
  Administered 2012-10-04: 1 mg via INTRAVENOUS
  Filled 2012-10-04: qty 1

## 2012-10-04 MED ORDER — IOHEXOL 300 MG/ML  SOLN: 50.0000 mL | Freq: Once | INTRAMUSCULAR | Status: DC | PRN

## 2012-10-04 MED ORDER — IOHEXOL 300 MG/ML  SOLN: 100.0000 mL | Freq: Once | INTRAMUSCULAR | Status: DC | PRN

## 2012-10-04 MED ORDER — IOHEXOL 300 MG/ML  SOLN
50.0000 mL | Freq: Once | INTRAMUSCULAR | Status: AC | PRN
  Administered 2012-10-04: 50 mL via ORAL

## 2012-10-04 MED ORDER — IOHEXOL 300 MG/ML  SOLN
100.0000 mL | Freq: Once | INTRAMUSCULAR | Status: AC | PRN
  Administered 2012-10-04: 100 mL via INTRAVENOUS

## 2012-10-04 MED ORDER — SODIUM CHLORIDE 0.9 % IV SOLN
INTRAVENOUS | Status: DC
  Administered 2012-10-04: 12:00:00 via INTRAVENOUS

## 2012-10-04 NOTE — ED Provider Notes (Signed)
History     CSN: [id number]  Arrival date & time 10/04/12  1540   First MD Initiated Contact with Patient 10/04/12 1543      Chief Complaint  Patient presents with  . Abdominal Pain    (Consider location/radiation/quality/duration/timing/severity/associated sxs/prior treatment) HPI Pt with history of HIV currently well controlled with meds prescribed at the [facility] ID office reports vomiting for the last few days, was here earlier today for chest pain and cough and found to have free air on xray. She left AMA before CT could be done to tend to concerns at home and returns now to complete her workup. She states now she is also having worsening severe aching epigastric pain. She had an open hysterectomy about 2 months ago but no other more recently abdominal procedures.    Past Medical History  Diagnosis Date  . HIV (human immunodeficiency virus infection)   . TMJ syndrome   . Simple seizure     r/t taking tramadol  . Ovarian cyst   . Kidney stone   . Anxiety     Past Surgical History  Procedure Date  . Appendectomy   . Cesarean section   . Ankle surgery   . Tubal ligation   . Fracture surgery   . Lithotripsy   . Abdominal hysterectomy     No family history on file.  History  Substance Use Topics  . Smoking status: Current Every Day Smoker -- 0.5 packs/day    Types: Cigarettes  . Smokeless tobacco: Never Used  . Alcohol Use: No    OB History    Grav Para Term Preterm Abortions TAB SAB Ect Mult Living                  Review of Systems All other systems reviewed and are negative except as noted in HPI.   Allergies  Penicillins; Naproxen; and Tramadol  Home Medications   Current Outpatient Rx  Name  Route  Sig  Dispense  Refill  . ALPRAZOLAM 0.5 MG PO TABS   Oral   Take 0.5 mg by mouth 2 (two) times daily.          Marlin Canary HEADACHE PO   Oral   Take 1 packet by mouth daily as needed. For pain         . PREZISTA PO   Oral   Take 1 tablet  by mouth daily.         Marland Kitchen EMTRICITABINE-TENOFOVIR 200-300 MG PO TABS   Oral   Take 1 tablet by mouth 2 (two) times daily. HIV medication.  Consult needed.         Marland Kitchen HYDROCODONE-ACETAMINOPHEN 5-325 MG PO TABS   Oral   Take 1 tablet by mouth every 6 (six) hours as needed for pain.   30 tablet   0   . OXYCODONE-ACETAMINOPHEN 5-325 MG PO TABS   Oral   Take 1-2 tablets by mouth every 6 (six) hours as needed for pain.   20 tablet   0   . RITONAVIR 100 MG PO CAPS   Oral   Take 100 mg by mouth 2 (two) times daily. HIV medication.  Consult needed.         Marland Kitchen TAMSULOSIN HCL 0.4 MG PO CAPS      Take 1 capsule daily until stone passes.   15 capsule   0     BP 111/82  Pulse 85  Temp 98.4 F (36.9 C) (Oral)  Resp  18  SpO2 99%  LMP 06/25/2012  Physical Exam  Nursing note and vitals reviewed. Constitutional: She is oriented to person, place, and time. She appears well-developed and well-nourished.  HENT:  Head: Normocephalic and atraumatic.  Eyes: EOM are normal. Pupils are equal, round, and reactive to light.  Neck: Normal range of motion. Neck supple.  Cardiovascular: Normal rate, normal heart sounds and intact distal pulses.   Pulmonary/Chest: Effort normal and breath sounds normal.  Abdominal: Bowel sounds are normal. She exhibits no distension. There is tenderness (diffuse, worse in epigastric area). There is guarding. There is no rebound.  Musculoskeletal: Normal range of motion. She exhibits no edema and no tenderness.  Neurological: She is alert and oriented to person, place, and time. She has normal strength. No cranial nerve deficit or sensory deficit.  Skin: Skin is warm and dry. No rash noted.  Psychiatric: She has a normal mood and affect.    ED Course  Procedures (including critical care time)  Labs Reviewed - No data to display Dg Chest 2 View  10/04/2012  *RADIOLOGY REPORT*  Clinical Data: Cough.  Chest discomfort.  Vomited 10/01/2012.  HIV positive.   CHEST - 2 VIEW  Comparison: 07/23/2010  Findings: Midline trachea.  Normal heart size and mediastinal contours. No pleural effusion or pneumothorax.  Diffuse peribronchial thickening.  Clear lungs.  There is free intraperitoneal air under the hemidiaphragms bilaterally.  IMPRESSION: Free intraperitoneal air, suspicious for bowel perforation. Abdominal imaging is recommended.  Peribronchial thickening which may relate to chronic bronchitis or smoking.  Critical test results telephoned to [facility] at the time of interpretation at 10:55 a.m. on 10/04/2012.   Original Report Authenticated By: Jeronimo Greaves, M.D.    Ct Abdomen Pelvis W Contrast  10/04/2012  *RADIOLOGY REPORT*  Clinical Data: Pneumoperitoneum noted on recent chest x-ray. Abdominal pain.  Status post hysterectomy.  History of HIV.  CT ABDOMEN AND PELVIS WITH CONTRAST  Technique:  Multidetector CT imaging of the abdomen and pelvis was performed following the standard protocol during bolus administration of intravenous contrast.  Contrast: OMNIPAQUE IOHEXOL 300 MG/ML  SOLN, 50mL OMNIPAQUE IOHEXOL 300 MG/ML  SOLN  Comparison: CT of the abdomen and pelvis 05/28/2012.  Findings:  Lung Bases: Unremarkable.  Abdomen/Pelvis:  There is a small area of hypoperfusion adjacent the falciform ligament within the liver, most compatible with a benign perfusion anomaly.  The liver is otherwise unremarkable in appearance.  The appearance of the gallbladder, pancreas, bilateral adrenal glands and bilateral kidneys is unremarkable.  The spleen is mildly enlarged (13.9 cm AP).  There is a moderate volume of pneumoperitoneum.  This gas is both within the greater peritoneal cavity, and within the lesser sac. No definite source for the pneumoperitoneum is confidently identified on today's examination.  In the anatomic pelvis there is a small volume of free fluid and there is some stranding.  The left ovary is not confidently identified.  The right ovary is unremarkable  in appearance.  Status post hysterectomy.  A small locule of gas in the superior aspect of the vagina adjacent to the vaginal cuff and may be related to recent sexual activity or physical examination.  Urinary bladder is unremarkable in appearance.  No pathologic distension of small bowel.  The appendix is not confidently identified and is likely surgically absent.  Musculoskeletal: There are no aggressive appearing lytic or blastic lesions noted in the visualized portions of the skeleton.  IMPRESSION: 1.  Moderate volume of pneumoperitoneum both in the lesser  sac and the greater peritoneal cavity.  A definitive source of this pneumoperitoneum is not confidently identified on today's examination.  Clinical correlation is recommended. 2.  Small volume of free fluid the pelvis and small amount of stranding could suggest recent rupture of an ovarian cyst. Alternatively, the fluid may be related to whatever process has resulted in this pneumoperitoneum. 3.  Mild splenomegaly. 4.  Status post hysterectomy and appendectomy.  These results were called by telephone on 10/04/2012 at 05:40 p.m. to Dr. Bernette Mayers, who verbally acknowledged these results.   Original Report Authenticated By: Trudie Reed, M.D.    Dg Abd 2 Views  10/04/2012  *RADIOLOGY REPORT*  Clinical Data: Cough  ABDOMEN - 2 VIEW  Comparison: None.  Findings: There is free intraperitoneal gas in the abdomen.  Bowel perforation is not excluded.  No disproportionate dilatation of bowel.  IMPRESSION: Free intraperitoneal gas. Critical Value/emergent results were called by telephone at the time of interpretation on 10/04/2012 at 1200 hours to Drs. Zackowski, who verbally acknowledged these results.   Original Report Authenticated By: Jolaine Click, M.D.      No diagnosis found.    MDM  IVF, CT and pain/nausea meds ordered. Labs done earlier today do not need to repeated.   6:12 PM CT as above, redemonstrates free air but no definite source. Discussed  with Dr. Clent Ridges on call for General Surgery at [facility] where the patient has been seen before. He has accepted the patient for transfer for further evaluation. Pt informed of this plan.       Charles B. Bernette Mayers, MD 10/04/12 [phone]

## 2012-10-04 NOTE — ED Notes (Signed)
Patient transported to X-ray 

## 2012-10-04 NOTE — ED Notes (Signed)
C/o NP cough and chest discomfort from coughing so much since 10/01/12.  Vomited on 10/01/12, but none since.  Pt. is HIV positive and worried that she may have pneumonia.  Denies fever.

## 2012-10-04 NOTE — ED Notes (Signed)
Abdominal pain. Was seen here earlier today for same and signed out AMA.

## 2012-10-04 NOTE — ED Notes (Signed)
Called report to Olegario Messier, RN on 6N at [facility]. Pt going to room 654.

## 2012-10-10 NOTE — ED Provider Notes (Signed)
History     CSN: [id number]  Arrival date & time 10/04/12  0917   First MD Initiated Contact with Patient 10/04/12 [phone]      Chief Complaint  Patient presents with  . Cough    (Consider location/radiation/quality/duration/timing/severity/associated sxs/prior treatment) Patient is a 27 y.o. female presenting with cough. The history is provided by the patient.  Cough Associated symptoms include chest pain and shortness of breath. Pertinent negatives include no headaches and no eye redness.  History of HIV followed at [facility]. C/O nonproductive cough, chest discomfort with cough, vomited 10/01/12 but none since. Denies fever, diarrhea, abdominal pain. Patient concerned about having pneumonia.   Past Medical History  Diagnosis Date  . HIV (human immunodeficiency virus infection)   . TMJ syndrome   . Simple seizure     r/t taking tramadol  . Ovarian cyst   . Kidney stone   . Anxiety     Past Surgical History  Procedure Date  . Appendectomy   . Cesarean section   . Ankle surgery   . Tubal ligation   . Fracture surgery   . Lithotripsy   . Abdominal hysterectomy     No family history on file.  History  Substance Use Topics  . Smoking status: Current Every Day Smoker -- 0.5 packs/day    Types: Cigarettes  . Smokeless tobacco: Never Used  . Alcohol Use: No    OB History    Grav Para Term Preterm Abortions TAB SAB Ect Mult Living                  Review of Systems  Constitutional: Negative for fever.  HENT: Positive for congestion.   Eyes: Negative for redness.  Respiratory: Positive for cough and shortness of breath.   Cardiovascular: Positive for chest pain.  Gastrointestinal: Positive for nausea and vomiting. Negative for abdominal pain and diarrhea.  Genitourinary: Negative for dysuria.  Musculoskeletal: Negative for back pain.  Skin: Negative for rash.  Neurological: Negative for headaches.  Hematological: Does not bruise/bleed easily.    Allergies    Penicillins; Naproxen; and Tramadol  Home Medications   Current Outpatient Rx  Name  Route  Sig  Dispense  Refill  . ALPRAZOLAM 0.5 MG PO TABS   Oral   Take 0.5 mg by mouth 2 (two) times daily.          Marlin Canary HEADACHE PO   Oral   Take 1 packet by mouth daily as needed. For pain         . PREZISTA PO   Oral   Take 1 tablet by mouth daily.         Marland Kitchen EMTRICITABINE-TENOFOVIR 200-300 MG PO TABS   Oral   Take 1 tablet by mouth 2 (two) times daily. HIV medication.  Consult needed.         Marland Kitchen HYDROCODONE-ACETAMINOPHEN 5-325 MG PO TABS   Oral   Take 1 tablet by mouth every 6 (six) hours as needed for pain.   30 tablet   0   . OXYCODONE-ACETAMINOPHEN 5-325 MG PO TABS   Oral   Take 1-2 tablets by mouth every 6 (six) hours as needed for pain.   20 tablet   0   . RITONAVIR 100 MG PO CAPS   Oral   Take 100 mg by mouth 2 (two) times daily. HIV medication.  Consult needed.         Marland Kitchen TAMSULOSIN HCL 0.4 MG PO CAPS  Take 1 capsule daily until stone passes.   15 capsule   0     BP 98/76  Pulse 77  Temp 97.3 F (36.3 C) (Oral)  Resp 20  Ht 5\' 5"  (1.651 m)  Wt 150 lb (68.04 kg)  BMI 24.96 kg/m2  SpO2 97%  LMP 06/25/2012  Physical Exam  Nursing note and vitals reviewed. Constitutional: She is oriented to person, place, and time. She appears well-developed and well-nourished. No distress.  HENT:  Head: Normocephalic and atraumatic.  Mouth/Throat: Oropharynx is clear and moist.  Eyes: Conjunctivae normal and EOM are normal. Pupils are equal, round, and reactive to light.  Neck: Normal range of motion. Neck supple.  Cardiovascular: Normal rate, regular rhythm, normal heart sounds and intact distal pulses.   No murmur heard. Pulmonary/Chest: Effort normal and breath sounds normal. No respiratory distress. She has no wheezes. She has no rales.  Abdominal: Soft. Bowel sounds are normal. There is tenderness. There is no rebound and no guarding.       Mild RUQ  tenderness  Musculoskeletal: Normal range of motion. She exhibits no edema.  Neurological: She is alert and oriented to person, place, and time. No cranial nerve deficit. She exhibits normal muscle tone. Coordination normal.  Skin: Skin is warm. No rash noted.    ED Course  Procedures (including critical care time)  Labs Reviewed  CBC WITH DIFFERENTIAL - Abnormal; Notable for the following:    WBC 3.3 (*)     Platelets 70 (*)     Neutro Abs 1.6 (*)     All other components within normal limits  BASIC METABOLIC PANEL - Abnormal; Notable for the following:    GFR calc non Af Amer 76 (*)     GFR calc Af Amer 88 (*)     All other components within normal limits  LIPASE, BLOOD  HEPATIC FUNCTION PANEL   No results found.  Results for orders placed during the hospital encounter of 10/04/12  CBC WITH DIFFERENTIAL      Component Value Range   WBC 3.3 (*) 4.0 - 10.5 K/uL   RBC 4.35  3.87 - 5.11 MIL/uL   Hemoglobin 13.2  12.0 - 15.0 g/dL   HCT 16.1  09.6 - 04.5 %   MCV 87.8  78.0 - 100.0 fL   MCH 30.3  26.0 - 34.0 pg   MCHC 34.6  30.0 - 36.0 g/dL   RDW 40.9  81.1 - 91.4 %   Platelets 70 (*) 150 - 400 K/uL   Neutrophils Relative 49  43 - 77 %   Lymphocytes Relative 34  12 - 46 %   Monocytes Relative 11  3 - 12 %   Eosinophils Relative 5  0 - 5 %   Basophils Relative 1  0 - 1 %   Neutro Abs 1.6 (*) 1.7 - 7.7 K/uL   Lymphs Abs 1.1  0.7 - 4.0 K/uL   Monocytes Absolute 0.4  0.1 - 1.0 K/uL   Eosinophils Absolute 0.2  0.0 - 0.7 K/uL   Basophils Absolute 0.0  0.0 - 0.1 K/uL   Smear Review PLATELET COUNT CONFIRMED BY SMEAR    BASIC METABOLIC PANEL      Component Value Range   Sodium 137  135 - 145 mEq/L   Potassium 3.7  3.5 - 5.1 mEq/L   Chloride 103  96 - 112 mEq/L   CO2 26  19 - 32 mEq/L   Glucose, Bld 92  70 -  99 mg/dL   BUN 9  6 - 23 mg/dL   Creatinine, Ser 4.09  0.50 - 1.10 mg/dL   Calcium 9.3  8.4 - 81.1 mg/dL   GFR calc non Af Amer 76 (*) >90 mL/min   GFR calc Af Amer 88  (*) >90 mL/min  LIPASE, BLOOD      Component Value Range   Lipase 15  11 - 59 U/L  HEPATIC FUNCTION PANEL      Component Value Range   Total Protein 7.4  6.0 - 8.3 g/dL   Albumin 3.9  3.5 - 5.2 g/dL   AST 26  0 - 37 U/L   ALT 31  0 - 35 U/L   Alkaline Phosphatase 53  39 - 117 U/L   Total Bilirubin 0.9  0.3 - 1.2 mg/dL   Bilirubin, Direct 0.2  0.0 - 0.3 mg/dL   Indirect Bilirubin 0.7  0.3 - 0.9 mg/dL   Results for orders placed during the hospital encounter of 10/04/12  CBC WITH DIFFERENTIAL      Component Value Range   WBC 3.3 (*) 4.0 - 10.5 K/uL   RBC 4.35  3.87 - 5.11 MIL/uL   Hemoglobin 13.2  12.0 - 15.0 g/dL   HCT 91.4  78.2 - 95.6 %   MCV 87.8  78.0 - 100.0 fL   MCH 30.3  26.0 - 34.0 pg   MCHC 34.6  30.0 - 36.0 g/dL   RDW 21.3  08.6 - 57.8 %   Platelets 70 (*) 150 - 400 K/uL   Neutrophils Relative 49  43 - 77 %   Lymphocytes Relative 34  12 - 46 %   Monocytes Relative 11  3 - 12 %   Eosinophils Relative 5  0 - 5 %   Basophils Relative 1  0 - 1 %   Neutro Abs 1.6 (*) 1.7 - 7.7 K/uL   Lymphs Abs 1.1  0.7 - 4.0 K/uL   Monocytes Absolute 0.4  0.1 - 1.0 K/uL   Eosinophils Absolute 0.2  0.0 - 0.7 K/uL   Basophils Absolute 0.0  0.0 - 0.1 K/uL   Smear Review PLATELET COUNT CONFIRMED BY SMEAR    BASIC METABOLIC PANEL      Component Value Range   Sodium 137  135 - 145 mEq/L   Potassium 3.7  3.5 - 5.1 mEq/L   Chloride 103  96 - 112 mEq/L   CO2 26  19 - 32 mEq/L   Glucose, Bld 92  70 - 99 mg/dL   BUN 9  6 - 23 mg/dL   Creatinine, Ser 4.69  0.50 - 1.10 mg/dL   Calcium 9.3  8.4 - 62.9 mg/dL   GFR calc non Af Amer 76 (*) >90 mL/min   GFR calc Af Amer 88 (*) >90 mL/min  LIPASE, BLOOD      Component Value Range   Lipase 15  11 - 59 U/L  HEPATIC FUNCTION PANEL      Component Value Range   Total Protein 7.4  6.0 - 8.3 g/dL   Albumin 3.9  3.5 - 5.2 g/dL   AST 26  0 - 37 U/L   ALT 31  0 - 35 U/L   Alkaline Phosphatase 53  39 - 117 U/L   Total Bilirubin 0.9  0.3 - 1.2 mg/dL    Bilirubin, Direct 0.2  0.0 - 0.3 mg/dL   Indirect Bilirubin 0.7  0.3 - 0.9 mg/dL   Dg Chest 2 View  10/04/2012  *RADIOLOGY REPORT*  Clinical Data: Cough.  Chest discomfort.  Vomited 10/01/2012.  HIV positive.  CHEST - 2 VIEW  Comparison: 07/23/2010  Findings: Midline trachea.  Normal heart size and mediastinal contours. No pleural effusion or pneumothorax.  Diffuse peribronchial thickening.  Clear lungs.  There is free intraperitoneal air under the hemidiaphragms bilaterally.  IMPRESSION: Free intraperitoneal air, suspicious for bowel perforation. Abdominal imaging is recommended.  Peribronchial thickening which may relate to chronic bronchitis or smoking.  Critical test results telephoned to [facility] at the time of interpretation at 10:55 a.m. on 10/04/2012.   Original Report Authenticated By: Jeronimo Greaves, M.D.    Ct Abdomen Pelvis W Contrast  10/04/2012  *RADIOLOGY REPORT*  Clinical Data: Pneumoperitoneum noted on recent chest x-ray. Abdominal pain.  Status post hysterectomy.  History of HIV.  CT ABDOMEN AND PELVIS WITH CONTRAST  Technique:  Multidetector CT imaging of the abdomen and pelvis was performed following the standard protocol during bolus administration of intravenous contrast.  Contrast: OMNIPAQUE IOHEXOL 300 MG/ML  SOLN, 50mL OMNIPAQUE IOHEXOL 300 MG/ML  SOLN  Comparison: CT of the abdomen and pelvis 05/28/2012.  Findings:  Lung Bases: Unremarkable.  Abdomen/Pelvis:  There is a small area of hypoperfusion adjacent the falciform ligament within the liver, most compatible with a benign perfusion anomaly.  The liver is otherwise unremarkable in appearance.  The appearance of the gallbladder, pancreas, bilateral adrenal glands and bilateral kidneys is unremarkable.  The spleen is mildly enlarged (13.9 cm AP).  There is a moderate volume of pneumoperitoneum.  This gas is both within the greater peritoneal cavity, and within the lesser sac. No definite source for the pneumoperitoneum is  confidently identified on today's examination.  In the anatomic pelvis there is a small volume of free fluid and there is some stranding.  The left ovary is not confidently identified.  The right ovary is unremarkable in appearance.  Status post hysterectomy.  A small locule of gas in the superior aspect of the vagina adjacent to the vaginal cuff and may be related to recent sexual activity or physical examination.  Urinary bladder is unremarkable in appearance.  No pathologic distension of small bowel.  The appendix is not confidently identified and is likely surgically absent.  Musculoskeletal: There are no aggressive appearing lytic or blastic lesions noted in the visualized portions of the skeleton.  IMPRESSION: 1.  Moderate volume of pneumoperitoneum both in the lesser sac and the greater peritoneal cavity.  A definitive source of this pneumoperitoneum is not confidently identified on today's examination.  Clinical correlation is recommended. 2.  Small volume of free fluid the pelvis and small amount of stranding could suggest recent rupture of an ovarian cyst. Alternatively, the fluid may be related to whatever process has resulted in this pneumoperitoneum. 3.  Mild splenomegaly. 4.  Status post hysterectomy and appendectomy.  These results were called by telephone on 10/04/2012 at 05:40 p.m. to Dr. Bernette Mayers, who verbally acknowledged these results.   Original Report Authenticated By: Trudie Reed, M.D.    Dg Abd 2 Views  10/04/2012  *RADIOLOGY REPORT*  Clinical Data: Cough  ABDOMEN - 2 VIEW  Comparison: None.  Findings: There is free intraperitoneal gas in the abdomen.  Bowel perforation is not excluded.  No disproportionate dilatation of bowel.  IMPRESSION: Free intraperitoneal gas. Critical Value/emergent results were called by telephone at the time of interpretation on 10/04/2012 at 1200 hours to Drs. Ivis Henneman, who verbally acknowledged these results.   Original Report Authenticated By:  Jolaine Click,  M.D.       1. Bronchitis   2. HIV disease   3. Intra-abdominal free air of unknown etiology       MDM  HIV positive on antivirals followed at [facility]. Presented with nonproductive cough, chest discomfort associated with cough. Patient was worried about pneumonia. Work started with CXR depicting FREE AIR intra-abdominal. Abdominal films confirmed FREE intraperitoneal gas. CT ordered to further eval the free air.  Patient insisted on leaving AMA to pick up her kids from school. We tried to work with her to make other arrangements to pick up her children but she still insisted on going to get them and stated she would return. She left before CT was done.   Addendum: Patient did return and rechecked in Dr. Bernette Mayers now on duty and he will continue her care.         Shelda Jakes, MD 10/10/12 2100

## 2012-10-29 ENCOUNTER — Emergency Department (HOSPITAL_BASED_OUTPATIENT_CLINIC_OR_DEPARTMENT_OTHER): Payer: Medicaid Other

## 2012-10-29 ENCOUNTER — Encounter (HOSPITAL_BASED_OUTPATIENT_CLINIC_OR_DEPARTMENT_OTHER): Payer: Self-pay | Admitting: *Deleted

## 2012-10-29 ENCOUNTER — Emergency Department (HOSPITAL_BASED_OUTPATIENT_CLINIC_OR_DEPARTMENT_OTHER)
Admission: EM | Admit: 2012-10-29 | Discharge: 2012-10-29 | Disposition: A | Discharge status: Home / Self Care | Payer: Medicaid Other | Attending: Emergency Medicine | Admitting: Emergency Medicine

## 2012-10-29 DIAGNOSIS — M75101 Unspecified rotator cuff tear or rupture of right shoulder, not specified as traumatic: Secondary | ICD-10-CM

## 2012-10-29 DIAGNOSIS — Z87442 Personal history of urinary calculi: Secondary | ICD-10-CM | POA: Insufficient documentation

## 2012-10-29 DIAGNOSIS — M67919 Unspecified disorder of synovium and tendon, unspecified shoulder: Secondary | ICD-10-CM | POA: Insufficient documentation

## 2012-10-29 DIAGNOSIS — F411 Generalized anxiety disorder: Secondary | ICD-10-CM | POA: Insufficient documentation

## 2012-10-29 DIAGNOSIS — Z8739 Personal history of other diseases of the musculoskeletal system and connective tissue: Secondary | ICD-10-CM | POA: Insufficient documentation

## 2012-10-29 DIAGNOSIS — F17200 Nicotine dependence, unspecified, uncomplicated: Secondary | ICD-10-CM | POA: Insufficient documentation

## 2012-10-29 DIAGNOSIS — Z79899 Other long term (current) drug therapy: Secondary | ICD-10-CM | POA: Insufficient documentation

## 2012-10-29 DIAGNOSIS — Z8781 Personal history of (healed) traumatic fracture: Secondary | ICD-10-CM | POA: Insufficient documentation

## 2012-10-29 DIAGNOSIS — Z8669 Personal history of other diseases of the nervous system and sense organs: Secondary | ICD-10-CM | POA: Insufficient documentation

## 2012-10-29 DIAGNOSIS — M719 Bursopathy, unspecified: Secondary | ICD-10-CM | POA: Insufficient documentation

## 2012-10-29 DIAGNOSIS — Z8742 Personal history of other diseases of the female genital tract: Secondary | ICD-10-CM | POA: Insufficient documentation

## 2012-10-29 DIAGNOSIS — Z21 Asymptomatic human immunodeficiency virus [HIV] infection status: Secondary | ICD-10-CM | POA: Insufficient documentation

## 2012-10-29 MED ORDER — IBUPROFEN 800 MG PO TABS: 800.0000 mg | ORAL_TABLET | ORAL | Status: DC

## 2012-10-29 MED ORDER — IBUPROFEN 800 MG PO TABS: 800.0000 mg | ORAL_TABLET | Freq: Three times a day (TID) | ORAL | Status: DC

## 2012-10-29 MED ORDER — HYDROCODONE-ACETAMINOPHEN 5-325 MG PO TABS
1.0000 | ORAL_TABLET | Freq: Once | ORAL | Status: AC
  Administered 2012-10-29: 1 via ORAL
  Filled 2012-10-29: qty 1

## 2012-10-29 MED ORDER — HYDROCODONE-ACETAMINOPHEN 5-325 MG PO TABS: 1.0000 | ORAL_TABLET | ORAL | Status: DC | PRN

## 2012-10-29 NOTE — ED Provider Notes (Signed)
History    This chart was scribed for Dione Booze, MD by Donne Anon, ED Scribe. This patient was seen in room MH05/MH05 and the patient's care was started at 2051.   CSN: [id number]  Arrival date & time 10/29/12  1478   First MD Initiated Contact with Patient 10/29/12 2051      Chief Complaint  Patient presents with  . Shoulder Pain     The history is provided by the patient. No language interpreter was used.   Dawn Velasquez is a 27 y.o. 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 She reports that the pain is worse with movement. She denies any other pain. She took Advil 4 hours PTA with little relief.  Pt is a current everyday smoker (1 pack/day) but denies alcohol use. She is currently seeing Dr. Romeo Apple.  Past Medical History  Diagnosis Date  . HIV (human immunodeficiency virus infection)   . TMJ syndrome   . Simple seizure     r/t taking tramadol  . Ovarian cyst   . Kidney stone   . Anxiety     Past Surgical History  Procedure Laterality Date  . Appendectomy    . Cesarean section    . Ankle surgery    . Tubal ligation    . Fracture surgery    . Lithotripsy    . Abdominal hysterectomy      History reviewed. No pertinent family history.  History  Substance Use Topics  . Smoking status: Current Every Day Smoker -- 0.50 packs/day    Types: Cigarettes  . Smokeless tobacco: Never Used  . Alcohol Use: No     Review of Systems  Musculoskeletal: Positive for arthralgias.  All other systems reviewed and are negative.    Allergies  Penicillins; Naproxen; and Tramadol  Home Medications   Current Outpatient Rx  Name  Route  Sig  Dispense  Refill  . ALPRAZolam (XANAX) 0.5 MG tablet   Oral   Take 0.5 mg by mouth 2  (two) times daily.          . Aspirin-Acetaminophen-Caffeine (GOODY HEADACHE PO)   Oral   Take 1 packet by mouth daily as needed. For pain         . Darunavir Ethanolate (PREZISTA PO)   Oral   Take 1 tablet by mouth daily.         Marland Kitchen emtricitabine-tenofovir (TRUVADA) 200-300 MG per tablet   Oral   Take 1 tablet by mouth 2 (two) times daily. HIV medication.  Consult needed.         Marland Kitchen HYDROcodone-acetaminophen (NORCO/VICODIN) 5-325 MG per tablet   Oral   Take 1 tablet by mouth every 6 (six) hours as needed for pain.   30 tablet   0   . oxyCODONE-acetaminophen (PERCOCET/ROXICET) 5-325 MG per tablet   Oral   Take 1-2 tablets by mouth every 6 (six) hours as needed for pain.   20 tablet   0   . ritonavir (NORVIR) 100 MG capsule   Oral   Take 100 mg by mouth 2 (two) times daily. HIV medication.  Consult needed.         . Tamsulosin HCl (FLOMAX) 0.4 MG CAPS      Take 1 capsule daily until stone passes.  15 capsule   0     Triage Vitals; BP 113/73  Pulse 88  Temp(Src) 98.2 F (36.8 C) (Oral)  Resp 16  Ht 5\' 5"  (1.651 m)  Wt 150 lb (68.04 kg)  BMI 24.96 kg/m2  SpO2 100%  LMP 06/25/2012  Physical Exam  Nursing note and vitals reviewed. Constitutional: She is oriented to person, place, and time. She appears well-developed and well-nourished. No distress.  HENT:  Head: Normocephalic and atraumatic.  Eyes: EOM are normal.  Neck: Neck supple. No tracheal deviation present.  Cardiovascular: Normal rate.   Pulmonary/Chest: Effort normal. No respiratory distress.  Musculoskeletal: Normal range of motion.  Marked tenderness in the right anterior deltoid groove. Pain with passive ROM of right shoulder. Rotator cuff impingement signs are present. Neurovascular is in tact.  Neurological: She is alert and oriented to person, place, and time.  Skin: Skin is warm and dry.  Psychiatric: She has a normal mood and affect. Her behavior is normal.    ED Course   Procedures (including critical care time) DIAGNOSTIC STUDIES:  Oxygen Saturation is 100% on room air, normal by my interpretation.    COORDINATION OF CARE: 8:58 PM Discussed treatment plan which includes icing her shoulder, shoulder movement, medication and a sling with pt at bedside and pt agreed to plan.   Dg Shoulder Right  10/29/2012  *RADIOLOGY REPORT*  Clinical Data: Right shoulder pain.  RIGHT SHOULDER - 2+ VIEW  Comparison: None.  Findings: No acute bony abnormality.  Specifically, no fracture, subluxation, or dislocation.  Soft tissues are intact. Joint spaces are maintained.  Normal bone mineralization.  IMPRESSION: No acute bony abnormality.   Original Report Authenticated By: Charlett Nose, M.D.      1. Rotator cuff syndrome, right       MDM  Right rotator cuff strain. 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 Old records are reviewed and she was seen for right shoulder pain about 1 year ago. She'll be given a sling for comfort but is advised to put her shoulder through full range of motion to prevent adhesive capsulitis. She has ibuprofen at home and is advised to take 2400 mg a day. Prescriptions given for Norco and she is referred to orthopedics for followup.  I personally performed the services described in this documentation, which was scribed in my presence. The recorded information has been reviewed and is accurate.          Dione Booze, MD 10/29/12 2119

## 2012-10-29 NOTE — ED Notes (Signed)
MD at bedside. 

## 2012-10-29 NOTE — ED Notes (Signed)
Pt c/o right shoulder pain x 1 week

## 2012-11-02 ENCOUNTER — Encounter (HOSPITAL_BASED_OUTPATIENT_CLINIC_OR_DEPARTMENT_OTHER): Payer: Self-pay | Admitting: *Deleted

## 2012-11-02 ENCOUNTER — Emergency Department (HOSPITAL_BASED_OUTPATIENT_CLINIC_OR_DEPARTMENT_OTHER)
Admission: EM | Admit: 2012-11-02 | Discharge: 2012-11-02 | Disposition: A | Discharge status: Home / Self Care | Payer: Medicaid Other | Attending: Emergency Medicine | Admitting: Emergency Medicine

## 2012-11-02 ENCOUNTER — Emergency Department (HOSPITAL_BASED_OUTPATIENT_CLINIC_OR_DEPARTMENT_OTHER): Payer: Medicaid Other

## 2012-11-02 DIAGNOSIS — Y9289 Other specified places as the place of occurrence of the external cause: Secondary | ICD-10-CM | POA: Insufficient documentation

## 2012-11-02 DIAGNOSIS — Z7982 Long term (current) use of aspirin: Secondary | ICD-10-CM | POA: Insufficient documentation

## 2012-11-02 DIAGNOSIS — Z8742 Personal history of other diseases of the female genital tract: Secondary | ICD-10-CM | POA: Insufficient documentation

## 2012-11-02 DIAGNOSIS — Y939 Activity, unspecified: Secondary | ICD-10-CM | POA: Insufficient documentation

## 2012-11-02 DIAGNOSIS — Z8669 Personal history of other diseases of the nervous system and sense organs: Secondary | ICD-10-CM | POA: Insufficient documentation

## 2012-11-02 DIAGNOSIS — S59909A Unspecified injury of unspecified elbow, initial encounter: Secondary | ICD-10-CM | POA: Insufficient documentation

## 2012-11-02 DIAGNOSIS — Z8739 Personal history of other diseases of the musculoskeletal system and connective tissue: Secondary | ICD-10-CM | POA: Insufficient documentation

## 2012-11-02 DIAGNOSIS — Z21 Asymptomatic human immunodeficiency virus [HIV] infection status: Secondary | ICD-10-CM | POA: Insufficient documentation

## 2012-11-02 DIAGNOSIS — Z79899 Other long term (current) drug therapy: Secondary | ICD-10-CM | POA: Insufficient documentation

## 2012-11-02 DIAGNOSIS — Z8659 Personal history of other mental and behavioral disorders: Secondary | ICD-10-CM | POA: Insufficient documentation

## 2012-11-02 DIAGNOSIS — S46919A Strain of unspecified muscle, fascia and tendon at shoulder and upper arm level, unspecified arm, initial encounter: Secondary | ICD-10-CM

## 2012-11-02 DIAGNOSIS — IMO0002 Reserved for concepts with insufficient information to code with codable children: Secondary | ICD-10-CM | POA: Insufficient documentation

## 2012-11-02 DIAGNOSIS — Z87442 Personal history of urinary calculi: Secondary | ICD-10-CM | POA: Insufficient documentation

## 2012-11-02 DIAGNOSIS — W010XXA Fall on same level from slipping, tripping and stumbling without subsequent striking against object, initial encounter: Secondary | ICD-10-CM | POA: Insufficient documentation

## 2012-11-02 DIAGNOSIS — F17200 Nicotine dependence, unspecified, uncomplicated: Secondary | ICD-10-CM | POA: Insufficient documentation

## 2012-11-02 DIAGNOSIS — S60229A Contusion of unspecified hand, initial encounter: Secondary | ICD-10-CM | POA: Insufficient documentation

## 2012-11-02 DIAGNOSIS — S6990XA Unspecified injury of unspecified wrist, hand and finger(s), initial encounter: Secondary | ICD-10-CM | POA: Insufficient documentation

## 2012-11-02 NOTE — ED Notes (Signed)
States she fell today and right shoulder is hurting worse than when she was here couple days ago also states pain in left hand and left wrist

## 2012-11-02 NOTE — ED Provider Notes (Signed)
History     CSN: [id number]  Arrival date & time 11/02/12  1746   First MD Initiated Contact with Patient 11/02/12 2023      Chief Complaint  Patient presents with  . Shoulder Pain  . Wrist Pain    (Consider location/radiation/quality/duration/timing/severity/associated sxs/prior treatment) Patient is a 27 y.o. female presenting with shoulder pain and wrist pain.  Shoulder Pain  Wrist Pain   Pt well known to this ED for numerous visits for various pain complaints was seen for atraumatic R shoulder pain 4 days ago, diagnosed as a rotator cuff syndrome and discharged with pain medication. She states she slipped and fell on the ice today injuring her R shoulder and exacerbating her pain. She is complaining of severe aching pain, worse with movement. She also has mild pinching L hand pain. Denies head injury or LOC.   Past Medical History  Diagnosis Date  . HIV (human immunodeficiency virus infection)   . TMJ syndrome   . Simple seizure     r/t taking tramadol  . Ovarian cyst   . Kidney stone   . Anxiety     Past Surgical History  Procedure Laterality Date  . Appendectomy    . Cesarean section    . Ankle surgery    . Tubal ligation    . Fracture surgery    . Lithotripsy    . Abdominal hysterectomy      History reviewed. No pertinent family history.  History  Substance Use Topics  . Smoking status: Current Every Day Smoker -- 0.50 packs/day    Types: Cigarettes  . Smokeless tobacco: Never Used  . Alcohol Use: No    OB History   Grav Para Term Preterm Abortions TAB SAB Ect Mult Living                  Review of Systems All other systems reviewed and are negative except as noted in HPI.   Allergies  Penicillins; Naproxen; and Tramadol  Home Medications   Current Outpatient Rx  Name  Route  Sig  Dispense  Refill  . ALPRAZolam (XANAX) 0.5 MG tablet   Oral   Take 0.5 mg by mouth 2 (two) times daily.          . Aspirin-Acetaminophen-Caffeine (GOODY  HEADACHE PO)   Oral   Take 1 packet by mouth daily as needed. For pain         . Darunavir Ethanolate (PREZISTA PO)   Oral   Take 1 tablet by mouth daily.         Marland Kitchen emtricitabine-tenofovir (TRUVADA) 200-300 MG per tablet   Oral   Take 1 tablet by mouth 2 (two) times daily. HIV medication.  Consult needed.         Marland Kitchen HYDROcodone-acetaminophen (NORCO/VICODIN) 5-325 MG per tablet   Oral   Take 1 tablet by mouth every 6 (six) hours as needed for pain.   30 tablet   0   . HYDROcodone-acetaminophen (NORCO/VICODIN) 5-325 MG per tablet   Oral   Take 1 tablet by mouth every 4 (four) hours as needed for pain.   20 tablet   0   . ibuprofen (ADVIL,MOTRIN) 800 MG tablet   Oral   Take 1 tablet (800 mg total) by mouth 3 (three) times daily.   30 tablet   0   . oxyCODONE-acetaminophen (PERCOCET/ROXICET) 5-325 MG per tablet   Oral   Take 1-2 tablets by mouth every 6 (six) hours as needed  for pain.   20 tablet   0   . ritonavir (NORVIR) 100 MG capsule   Oral   Take 100 mg by mouth 2 (two) times daily. HIV medication.  Consult needed.         . Tamsulosin HCl (FLOMAX) 0.4 MG CAPS      Take 1 capsule daily until stone passes.   15 capsule   0     BP 101/73  Pulse 100  Temp(Src) 98.5 F (36.9 C) (Oral)  SpO2 99%  LMP 06/25/2012  Physical Exam  Nursing note and vitals reviewed. Constitutional: She is oriented to person, place, and time. She appears well-developed and well-nourished.  HENT:  Head: Normocephalic and atraumatic.  Eyes: EOM are normal. Pupils are equal, round, and reactive to light.  Neck: Normal range of motion. Neck supple.  Cardiovascular: Normal rate, normal heart sounds and intact distal pulses.   Pulmonary/Chest: Effort normal and breath sounds normal.  Abdominal: Bowel sounds are normal. She exhibits no distension. There is no tenderness.  Musculoskeletal: She exhibits tenderness (tenderness to palpation of the R shoulder over the deltoid groove,  R AC joint, unable to ROM due to pain). She exhibits no edema.  Mild contusion to L hand, no wrist pain or deformity, no snuffbox tenderness  Neurological: She is alert and oriented to person, place, and time. She has normal strength. No cranial nerve deficit or sensory deficit.  Skin: Skin is warm and dry. No rash noted.  Psychiatric: She has a normal mood and affect.    ED Course  Procedures (including critical care time)  Labs Reviewed - No data to display Dg Shoulder Right  11/02/2012  *RADIOLOGY REPORT*  Clinical Data: Right shoulder pain following fall and injury.  RIGHT SHOULDER - 2+ VIEW  Comparison: 10/29/2012  Findings: There is no evidence of acute bony abnormality. There is no evidence of acute fracture, subluxation, or dislocation. No focal bony lesions are identified. The visualized right hemithorax is unremarkable.  IMPRESSION: No evidence of acute bony abnormality.   Original Report Authenticated By: Harmon Pier, M.D.    Dg Hand Complete Left  11/02/2012  *RADIOLOGY REPORT*  Clinical Data: Left hand injury and pain.  LEFT HAND - COMPLETE 3+ VIEW  Comparison: None  Findings: No evidence of acute fracture, subluxation or dislocation identified.  No radio-opaque foreign bodies are present.  No focal bony lesions are noted.  The joint spaces are unremarkable.  IMPRESSION: No evidence of acute bony abnormality.   Original Report Authenticated By: Harmon Pier, M.D.      No diagnosis found.    MDM  Xrays neg. She has sling from recent visit. Advised follow up with Ortho as previously recommended.        Tylek Boney B. Bernette Mayers, MD 11/02/12 2152

## 2012-11-09 ENCOUNTER — Emergency Department (HOSPITAL_BASED_OUTPATIENT_CLINIC_OR_DEPARTMENT_OTHER)
Admission: EM | Admit: 2012-11-09 | Discharge: 2012-11-09 | Disposition: A | Discharge status: Home / Self Care | Payer: Medicaid Other | Attending: Emergency Medicine | Admitting: Emergency Medicine

## 2012-11-09 ENCOUNTER — Encounter (HOSPITAL_BASED_OUTPATIENT_CLINIC_OR_DEPARTMENT_OTHER): Payer: Self-pay | Admitting: *Deleted

## 2012-11-09 DIAGNOSIS — S199XXA Unspecified injury of neck, initial encounter: Secondary | ICD-10-CM | POA: Insufficient documentation

## 2012-11-09 DIAGNOSIS — Z8669 Personal history of other diseases of the nervous system and sense organs: Secondary | ICD-10-CM | POA: Insufficient documentation

## 2012-11-09 DIAGNOSIS — S0993XA Unspecified injury of face, initial encounter: Secondary | ICD-10-CM | POA: Insufficient documentation

## 2012-11-09 DIAGNOSIS — W208XXA Other cause of strike by thrown, projected or falling object, initial encounter: Secondary | ICD-10-CM | POA: Insufficient documentation

## 2012-11-09 DIAGNOSIS — Z21 Asymptomatic human immunodeficiency virus [HIV] infection status: Secondary | ICD-10-CM | POA: Insufficient documentation

## 2012-11-09 DIAGNOSIS — Y929 Unspecified place or not applicable: Secondary | ICD-10-CM | POA: Insufficient documentation

## 2012-11-09 DIAGNOSIS — F17200 Nicotine dependence, unspecified, uncomplicated: Secondary | ICD-10-CM | POA: Insufficient documentation

## 2012-11-09 DIAGNOSIS — Y9389 Activity, other specified: Secondary | ICD-10-CM | POA: Insufficient documentation

## 2012-11-09 DIAGNOSIS — F411 Generalized anxiety disorder: Secondary | ICD-10-CM | POA: Insufficient documentation

## 2012-11-09 DIAGNOSIS — K1379 Other lesions of oral mucosa: Secondary | ICD-10-CM

## 2012-11-09 DIAGNOSIS — Z79899 Other long term (current) drug therapy: Secondary | ICD-10-CM | POA: Insufficient documentation

## 2012-11-09 DIAGNOSIS — Z8742 Personal history of other diseases of the female genital tract: Secondary | ICD-10-CM | POA: Insufficient documentation

## 2012-11-09 DIAGNOSIS — Z87442 Personal history of urinary calculi: Secondary | ICD-10-CM | POA: Insufficient documentation

## 2012-11-09 DIAGNOSIS — Z8719 Personal history of other diseases of the digestive system: Secondary | ICD-10-CM | POA: Insufficient documentation

## 2012-11-09 MED ORDER — ACETAMINOPHEN 325 MG PO TABS
650.0000 mg | ORAL_TABLET | Freq: Once | ORAL | Status: AC
  Administered 2012-11-09: 650 mg via ORAL
  Filled 2012-11-09: qty 2
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
## 2012-11-09 NOTE — ED Notes (Signed)
Pt c/o opening up locker and cosmetic bag hitting her lower lip x 1 hr ago

## 2012-12-16 ENCOUNTER — Emergency Department (HOSPITAL_COMMUNITY)
Admission: EM | Admit: 2012-12-16 | Discharge: 2012-12-16 | Disposition: A | Discharge status: Home / Self Care | Payer: Medicaid Other | Attending: Emergency Medicine | Admitting: Emergency Medicine

## 2012-12-16 ENCOUNTER — Encounter (HOSPITAL_COMMUNITY): Payer: Self-pay | Admitting: Emergency Medicine

## 2012-12-16 DIAGNOSIS — Z8742 Personal history of other diseases of the female genital tract: Secondary | ICD-10-CM | POA: Insufficient documentation

## 2012-12-16 DIAGNOSIS — Z87442 Personal history of urinary calculi: Secondary | ICD-10-CM | POA: Insufficient documentation

## 2012-12-16 DIAGNOSIS — Z8669 Personal history of other diseases of the nervous system and sense organs: Secondary | ICD-10-CM | POA: Insufficient documentation

## 2012-12-16 DIAGNOSIS — F411 Generalized anxiety disorder: Secondary | ICD-10-CM | POA: Insufficient documentation

## 2012-12-16 DIAGNOSIS — Z8719 Personal history of other diseases of the digestive system: Secondary | ICD-10-CM | POA: Insufficient documentation

## 2012-12-16 DIAGNOSIS — Z79899 Other long term (current) drug therapy: Secondary | ICD-10-CM | POA: Insufficient documentation

## 2012-12-16 DIAGNOSIS — R51 Headache: Secondary | ICD-10-CM | POA: Insufficient documentation

## 2012-12-16 DIAGNOSIS — Z21 Asymptomatic human immunodeficiency virus [HIV] infection status: Secondary | ICD-10-CM | POA: Insufficient documentation

## 2012-12-16 DIAGNOSIS — Z7982 Long term (current) use of aspirin: Secondary | ICD-10-CM | POA: Insufficient documentation

## 2012-12-16 DIAGNOSIS — K029 Dental caries, unspecified: Secondary | ICD-10-CM | POA: Insufficient documentation

## 2012-12-16 DIAGNOSIS — F17200 Nicotine dependence, unspecified, uncomplicated: Secondary | ICD-10-CM | POA: Insufficient documentation

## 2012-12-16 MED ORDER — CLINDAMYCIN HCL 150 MG PO CAPS: 150.0000 mg | ORAL_CAPSULE | Freq: Four times a day (QID) | ORAL | Status: DC

## 2012-12-16 MED ORDER — HYDROCODONE-ACETAMINOPHEN 5-325 MG PO TABS: 1.0000 | ORAL_TABLET | ORAL | Status: DC | PRN

## 2012-12-16 MED ORDER — OXYCODONE-ACETAMINOPHEN 5-325 MG PO TABS
1.0000 | ORAL_TABLET | Freq: Once | ORAL | Status: AC
  Administered 2012-12-16: 1 via ORAL
  Filled 2012-12-16: qty 1

## 2012-12-16 NOTE — ED Provider Notes (Signed)
History     CSN: [id number]  Arrival date & time 12/16/12  1640   First MD Initiated Contact with Patient 12/16/12 1642      Chief Complaint  Patient presents with  . Dental Pain    (Consider location/radiation/quality/duration/timing/severity/associated sxs/prior treatment) HPI Dawn Velasquez is a 27 y.o. female who presents to the ED with dental pain. She is HIV positive. She states that today she began having severe dental pain on the right side of her jaw. The pain is getting worse. She denies fever or chills or other problems.  Past Medical History  Diagnosis Date  . HIV (human immunodeficiency virus infection)   . TMJ syndrome   . Simple seizure     r/t taking tramadol  . Ovarian cyst   . Kidney stone   . Anxiety     Past Surgical History  Procedure Laterality Date  . Appendectomy    . Cesarean section    . Ankle surgery    . Tubal ligation    . Fracture surgery    . Lithotripsy    . Abdominal hysterectomy      History reviewed. No pertinent family history.  History  Substance Use Topics  . Smoking status: Current Every Day Smoker -- 0.50 packs/day    Types: Cigarettes  . Smokeless tobacco: Never Used  . Alcohol Use: No    OB History   Grav Para Term Preterm Abortions TAB SAB Ect Mult Living                  Review of Systems  Constitutional: Negative for fever and chills.  HENT: Positive for dental problem. Negative for ear pain and neck stiffness.   Respiratory: Negative for cough.   Gastrointestinal: Negative for nausea and vomiting.  Skin: Negative for rash and wound.  Allergic/Immunologic: Positive for immunocompromised state.  Neurological: Positive for headaches (associated with the dental pain).    Allergies  Penicillins; Naproxen; and Tramadol  Home Medications   Current Outpatient Rx  Name  Route  Sig  Dispense  Refill  . ALPRAZolam (XANAX) 0.5 MG tablet   Oral   Take 0.5 mg by mouth 2 (two) times daily.            . Aspirin-Acetaminophen-Caffeine (GOODY HEADACHE PO)   Oral   Take 1 packet by mouth daily as needed. For pain         . Darunavir Ethanolate (PREZISTA PO)   Oral   Take 1 tablet by mouth daily.         Marland Kitchen emtricitabine-tenofovir (TRUVADA) 200-300 MG per tablet   Oral   Take 1 tablet by mouth 2 (two) times daily. HIV medication.  Consult needed.         . ritonavir (NORVIR) 100 MG capsule   Oral   Take 100 mg by mouth 2 (two) times daily. HIV medication.  Consult needed.         . clindamycin (CLEOCIN) 150 MG capsule   Oral   Take 1 capsule (150 mg total) by mouth every 6 (six) hours.   28 capsule   0   . HYDROcodone-acetaminophen (NORCO/VICODIN) 5-325 MG per tablet   Oral   Take 1 tablet by mouth every 4 (four) hours as needed.   20 tablet   0     BP 141/92  Pulse 100  Temp(Src) 97.4 F (36.3 C) (Oral)  Resp 24  Ht 5\' 5"  (1.651 m)  Wt 140 lb (63.504 kg)  BMI 23.3 kg/m2  SpO2 100%  LMP 06/25/2012  Physical Exam  Nursing note and vitals reviewed. Constitutional: She is oriented to person, place, and time. No distress.  HENT:  Head: Normocephalic.  Right Ear: Tympanic membrane normal.  Left Ear: Tympanic membrane normal.  Nose: Nose normal.  Mouth/Throat: Uvula is midline, oropharynx is clear and moist and mucous membranes are normal.    Dental tenderness on exam lower right molars  Eyes: EOM are normal.  Neck: Neck supple.  Cardiovascular: Normal rate.   Pulmonary/Chest: Effort normal.  Abdominal: Soft. There is no tenderness.  Musculoskeletal: Normal range of motion.  Lymphadenopathy:    She has no cervical adenopathy.  Neurological: She is alert and oriented to person, place, and time.  Skin: Skin is warm and dry.  Psychiatric: She has a normal mood and affect.    ED Course  Procedures (including critical care time)    1. Pain due to dental caries    MDM  27 y.o. female with dental pain and HIV positive. Will treat with antibiotics  and pain medication. She is to follow up with her doctor as soon as possible. She will return here as needed. I have reviewed this patient's vital signs, nurses notes and discussed plan of care with the patient. Patient voices understanding.    Medication List    TAKE these medications       clindamycin 150 MG capsule  Commonly known as:  CLEOCIN  Take 1 capsule (150 mg total) by mouth every 6 (six) hours.     HYDROcodone-acetaminophen 5-325 MG per tablet  Commonly known as:  NORCO/VICODIN  Take 1 tablet by mouth every 4 (four) hours as needed.      ASK your doctor about these medications       ALPRAZolam 0.5 MG tablet  Commonly known as:  XANAX  Take 0.5 mg by mouth 2 (two) times daily.     emtricitabine-tenofovir 200-300 MG per tablet  Commonly known as:  TRUVADA  Take 1 tablet by mouth 2 (two) times daily. HIV medication.  Consult needed.     GOODY HEADACHE PO  Take 1 packet by mouth daily as needed. For pain     PREZISTA PO  Take 1 tablet by mouth daily.     ritonavir 100 MG capsule  Commonly known as:  NORVIR  Take 100 mg by mouth 2 (two) times daily. HIV medication.  Consult needed.             [facility] Orlene Och, NP 12/16/12 1739

## 2012-12-16 NOTE — ED Notes (Signed)
r lower dental pain x 1 day. Pt tearful. No obvious swelling noted

## 2012-12-16 NOTE — ED Notes (Signed)
nad noted prior to dc. Dc instructions reviewed with pt and 2 scripts given prior to dc. Ambulated out without difficulty.

## 2012-12-16 NOTE — Discharge Instructions (Signed)
Follow-up with a dentist as soon as possible.

## 2012-12-19 NOTE — ED Provider Notes (Signed)
Medical screening examination/treatment/procedure(s) were performed by non-physician practitioner and as supervising physician I was immediately available for consultation/collaboration.  Gianlucas Evenson, MD 12/19/12 1344 

## 2012-12-26 ENCOUNTER — Emergency Department (HOSPITAL_BASED_OUTPATIENT_CLINIC_OR_DEPARTMENT_OTHER)
Admission: EM | Admit: 2012-12-26 | Discharge: 2012-12-26 | Discharge status: Against Medical Advice | Payer: Medicaid Other | Attending: Emergency Medicine | Admitting: Emergency Medicine

## 2012-12-26 ENCOUNTER — Encounter (HOSPITAL_BASED_OUTPATIENT_CLINIC_OR_DEPARTMENT_OTHER): Payer: Self-pay | Admitting: Emergency Medicine

## 2012-12-26 DIAGNOSIS — R1110 Vomiting, unspecified: Secondary | ICD-10-CM | POA: Insufficient documentation

## 2012-12-26 LAB — URINALYSIS, ROUTINE W REFLEX MICROSCOPIC
Glucose, UA: NEGATIVE mg/dL
Ketones, ur: 15 mg/dL — AB
Protein, ur: NEGATIVE mg/dL
Urobilinogen, UA: 1 mg/dL (ref 0.0–1.0)

## 2012-12-26 LAB — URINE MICROSCOPIC-ADD ON

## 2012-12-26 NOTE — ED Notes (Signed)
Pt c/o left flank pain and vomiting.  

## 2012-12-27 ENCOUNTER — Emergency Department (HOSPITAL_BASED_OUTPATIENT_CLINIC_OR_DEPARTMENT_OTHER)
Admission: EM | Admit: 2012-12-27 | Discharge: 2012-12-27 | Disposition: A | Discharge status: Home / Self Care | Payer: Medicaid Other | Attending: Emergency Medicine | Admitting: Emergency Medicine

## 2012-12-27 ENCOUNTER — Emergency Department (HOSPITAL_BASED_OUTPATIENT_CLINIC_OR_DEPARTMENT_OTHER): Payer: Medicaid Other

## 2012-12-27 ENCOUNTER — Encounter (HOSPITAL_BASED_OUTPATIENT_CLINIC_OR_DEPARTMENT_OTHER): Payer: Self-pay | Admitting: Family Medicine

## 2012-12-27 DIAGNOSIS — Z21 Asymptomatic human immunodeficiency virus [HIV] infection status: Secondary | ICD-10-CM | POA: Insufficient documentation

## 2012-12-27 DIAGNOSIS — M549 Dorsalgia, unspecified: Secondary | ICD-10-CM | POA: Insufficient documentation

## 2012-12-27 DIAGNOSIS — Z8719 Personal history of other diseases of the digestive system: Secondary | ICD-10-CM | POA: Insufficient documentation

## 2012-12-27 DIAGNOSIS — R945 Abnormal results of liver function studies: Secondary | ICD-10-CM | POA: Insufficient documentation

## 2012-12-27 DIAGNOSIS — R300 Dysuria: Secondary | ICD-10-CM | POA: Insufficient documentation

## 2012-12-27 DIAGNOSIS — R3911 Hesitancy of micturition: Secondary | ICD-10-CM | POA: Insufficient documentation

## 2012-12-27 DIAGNOSIS — R110 Nausea: Secondary | ICD-10-CM | POA: Insufficient documentation

## 2012-12-27 DIAGNOSIS — G8929 Other chronic pain: Secondary | ICD-10-CM | POA: Insufficient documentation

## 2012-12-27 DIAGNOSIS — F411 Generalized anxiety disorder: Secondary | ICD-10-CM | POA: Insufficient documentation

## 2012-12-27 DIAGNOSIS — Z87442 Personal history of urinary calculi: Secondary | ICD-10-CM | POA: Insufficient documentation

## 2012-12-27 DIAGNOSIS — Z79899 Other long term (current) drug therapy: Secondary | ICD-10-CM | POA: Insufficient documentation

## 2012-12-27 DIAGNOSIS — Z8742 Personal history of other diseases of the female genital tract: Secondary | ICD-10-CM | POA: Insufficient documentation

## 2012-12-27 DIAGNOSIS — R109 Unspecified abdominal pain: Secondary | ICD-10-CM | POA: Insufficient documentation

## 2012-12-27 DIAGNOSIS — Z8669 Personal history of other diseases of the nervous system and sense organs: Secondary | ICD-10-CM | POA: Insufficient documentation

## 2012-12-27 LAB — COMPREHENSIVE METABOLIC PANEL
ALT: 390 U/L — ABNORMAL HIGH (ref 0–35)
Alkaline Phosphatase: 131 U/L — ABNORMAL HIGH (ref 39–117)
CO2: 23 mEq/L (ref 19–32)
GFR calc Af Amer: >90 mL/min (ref >90)
GFR calc non Af Amer: >90 mL/min (ref >90)
Glucose, Bld: 88 mg/dL (ref 70–99)
Potassium: 3.8 mEq/L (ref 3.5–5.1)
Sodium: 139 mEq/L (ref 135–145)

## 2012-12-27 LAB — CBC WITH DIFFERENTIAL/PLATELET
Basophils Relative: 1 % (ref 0–1)
Eosinophils Relative: 1 % (ref 0–5)
Hemoglobin: 14.3 g/dL (ref 12.0–15.0)
Lymphocytes Relative: 20 % (ref 12–46)
MCH: 30.6 pg (ref 26.0–34.0)
Monocytes Absolute: 0.4 10*3/uL (ref 0.1–1.0)
Neutrophils Relative %: 69 % (ref 43–77)
RBC: 4.67 MIL/uL (ref 3.87–5.11)

## 2012-12-27 MED ORDER — ONDANSETRON 4 MG PO TBDP: ORAL_TABLET | ORAL | Status: DC

## 2012-12-27 MED ORDER — KETOROLAC TROMETHAMINE 30 MG/ML IJ SOLN
30.0000 mg | Freq: Once | INTRAMUSCULAR | Status: AC
  Administered 2012-12-27: 30 mg via INTRAVENOUS
  Filled 2012-12-27: qty 1

## 2012-12-27 MED ORDER — MORPHINE SULFATE 4 MG/ML IJ SOLN
4.0000 mg | Freq: Once | INTRAMUSCULAR | Status: AC
  Administered 2012-12-27: 4 mg via INTRAVENOUS
  Filled 2012-12-27: qty 1

## 2012-12-27 MED ORDER — SODIUM CHLORIDE 0.9 % IV BOLUS (SEPSIS)
1000.0000 mL | Freq: Once | INTRAVENOUS | Status: AC
  Administered 2012-12-27: 1000 mL via INTRAVENOUS

## 2012-12-27 MED ORDER — ONDANSETRON HCL 4 MG/2ML IJ SOLN
4.0000 mg | Freq: Once | INTRAMUSCULAR | Status: AC
  Administered 2012-12-27: 4 mg via INTRAVENOUS
  Filled 2012-12-27: qty 2

## 2012-12-27 NOTE — Discharge Instructions (Signed)
Abdominal Pain (Nonspecific)  Your exam might not show the exact reason you have abdominal pain. Since there are many different causes of abdominal pain, another checkup and more tests may be needed. It is very important to follow up for lasting (persistent) or worsening symptoms. A possible cause of abdominal pain in any person who still has his or her appendix is acute appendicitis. Appendicitis is often hard to diagnose. Normal blood tests, urine tests, ultrasound, and CT scans do not completely rule out early appendicitis or other causes of abdominal pain. Sometimes, only the changes that happen over time will allow appendicitis and other causes of abdominal pain to be determined. Other potential problems that may require surgery may also take time to become more apparent. Because of this, it is important that you follow all of the instructions below.  HOME CARE INSTRUCTIONS    Rest as much as possible.   Do not eat solid food until your pain is gone.   While adults or children have pain: A diet of water, weak decaffeinated tea, broth or bouillon, gelatin, oral rehydration solutions (ORS), frozen ice pops, or ice chips may be helpful.   When pain is gone in adults or children: Start a light diet (dry toast, crackers, applesauce, or white rice). Increase the diet slowly as long as it does not bother you. Eat no dairy products (including cheese and eggs) and no spicy, fatty, fried, or high-fiber foods.   Use no alcohol, caffeine, or cigarettes.   Take your regular medicines unless your caregiver told you not to.   Take any prescribed medicine as directed.   Only take over-the-counter or prescription medicines for pain, discomfort, or fever as directed by your caregiver. Do not give aspirin to children.  If your caregiver has given you a follow-up appointment, it is very important to keep that appointment. Not keeping the appointment could result in a permanent injury and/or lasting (chronic) pain and/or  disability. If there is any problem keeping the appointment, you must call to reschedule.   SEEK IMMEDIATE MEDICAL CARE IF:    Your pain is not gone in 24 hours.   Your pain becomes worse, changes location, or feels different.   You or your child has an oral temperature above 102 F (38.9 C), not controlled by medicine.   Your baby is older than 3 months with a rectal temperature of 102 F (38.9 C) or higher.   Your baby is 3 months old or younger with a rectal temperature of 100.4 F (38 C) or higher.   You have shaking chills.   You keep throwing up (vomiting) or cannot drink liquids.   There is blood in your vomit or you see blood in your bowel movements.   Your bowel movements become dark or black.   You have frequent bowel movements.   Your bowel movements stop (become blocked) or you cannot pass gas.   You have bloody, frequent, or painful urination.   You have yellow discoloration in the skin or whites of the eyes.   Your stomach becomes bloated or bigger.   You have dizziness or fainting.   You have chest or back pain.  MAKE SURE YOU:    Understand these instructions.   Will watch your condition.   Will get help right away if you are not doing well or get worse.  Document Released: 09/05/2005 Document Revised: 11/28/2011 Document Reviewed: 08/03/2009  [employer] Patient Information 2013 [employer].

## 2012-12-27 NOTE — ED Notes (Signed)
Pt c/o left flank and left groin pain since last night. Pt sts she was triaged last night and gave urine sample but left before being placed in a room.

## 2012-12-27 NOTE — ED Provider Notes (Signed)
History     CSN: [id number]  Arrival date & time 12/27/12  1114   First MD Initiated Contact with Patient 12/27/12 1120      Chief Complaint  Patient presents with  . Flank Pain    (Consider location/radiation/quality/duration/timing/severity/associated sxs/prior treatment) HPI Pt with history of renal stones presents with L flank pain starting yesterday radiating to LLQ associated with urinary hesitancy, nausea. Pain is episodic. No fever, chills. No gross hematuria. No vaginal bleeding or d/c.  Past Medical History  Diagnosis Date  . HIV (human immunodeficiency virus infection)   . TMJ syndrome   . Simple seizure     r/t taking tramadol  . Ovarian cyst   . Kidney stone   . Anxiety     Past Surgical History  Procedure Laterality Date  . Appendectomy    . Cesarean section    . Ankle surgery    . Tubal ligation    . Fracture surgery    . Lithotripsy    . Abdominal hysterectomy      No family history on file.  History  Substance Use Topics  . Smoking status: Current Every Day Smoker -- 0.50 packs/day    Types: Cigarettes  . Smokeless tobacco: Never Used  . Alcohol Use: No    OB History   Grav Para Term Preterm Abortions TAB SAB Ect Mult Living                  Review of Systems  Constitutional: Negative for fever and chills.  Respiratory: Negative for shortness of breath.   Cardiovascular: Negative for chest pain.  Gastrointestinal: Positive for nausea and abdominal pain. Negative for vomiting, diarrhea, constipation and blood in stool.  Genitourinary: Positive for flank pain and difficulty urinating. Negative for dysuria, frequency, hematuria, vaginal bleeding, vaginal discharge and pelvic pain.  Musculoskeletal: Positive for back pain.  Skin: Negative for rash and wound.  All other systems reviewed and are negative.    Allergies  Penicillins; Naproxen; and Tramadol  Home Medications   Current Outpatient Rx  Name  Route  Sig  Dispense  Refill   . ALPRAZolam (XANAX) 0.5 MG tablet   Oral   Take 0.5 mg by mouth 2 (two) times daily.          . Aspirin-Acetaminophen-Caffeine (GOODY HEADACHE PO)   Oral   Take 1 packet by mouth daily as needed. For pain         . clindamycin (CLEOCIN) 150 MG capsule   Oral   Take 1 capsule (150 mg total) by mouth every 6 (six) hours.   28 capsule   0   . Darunavir Ethanolate (PREZISTA PO)   Oral   Take 1 tablet by mouth daily.         Marland Kitchen emtricitabine-tenofovir (TRUVADA) 200-300 MG per tablet   Oral   Take 1 tablet by mouth 2 (two) times daily. HIV medication.  Consult needed.         Marland Kitchen HYDROcodone-acetaminophen (NORCO/VICODIN) 5-325 MG per tablet   Oral   Take 1 tablet by mouth every 4 (four) hours as needed.   20 tablet   0   . ondansetron (ZOFRAN ODT) 4 MG disintegrating tablet      4mg  ODT q4 hours prn nausea/vomit   8 tablet   0   . ritonavir (NORVIR) 100 MG capsule   Oral   Take 100 mg by mouth 2 (two) times daily. HIV medication.  Consult needed.  BP 102/58  Pulse 78  Temp(Src) 98.5 F (36.9 C) (Oral)  Resp 18  SpO2 99%  LMP 06/25/2012  Physical Exam  Nursing note and vitals reviewed. Constitutional: She is oriented to person, place, and time. She appears well-developed and well-nourished. No distress.  HENT:  Head: Normocephalic and atraumatic.  Mouth/Throat: Oropharynx is clear and moist.  Eyes: EOM are normal. Pupils are equal, round, and reactive to light.  Neck: Normal range of motion. Neck supple.  Cardiovascular: Normal rate and regular rhythm.   Pulmonary/Chest: Effort normal and breath sounds normal. No respiratory distress. She has no wheezes. She has no rales.  Abdominal: Soft. Bowel sounds are normal. She exhibits no distension and no mass. There is no tenderness. There is no rebound and no guarding.  Musculoskeletal: Normal range of motion. She exhibits tenderness (mild L flank tenderness). She exhibits no edema.  Neurological: She  is alert and oriented to person, place, and time.  Moves all ext without deficit   Skin: Skin is warm and dry. No rash noted. No erythema.  Psychiatric:  Anxious     ED Course  Procedures (including critical care time)  Labs Reviewed  CBC WITH DIFFERENTIAL - Abnormal; Notable for the following:    Platelets 56 (*)    All other components within normal limits  COMPREHENSIVE METABOLIC PANEL - Abnormal; Notable for the following:    AST 325 (*)    ALT 390 (*)    Alkaline Phosphatase 131 (*)    Total Bilirubin 1.5 (*)    All other components within normal limits  ACETAMINOPHEN LEVEL  HEPATITIS PANEL, ACUTE  ANA   Ct Abdomen Pelvis Wo Contrast  12/27/2012  *RADIOLOGY REPORT*  Clinical Data: Left flank and groin pain.  Hematuria. Urolithiasis.  HIV.  CT ABDOMEN AND PELVIS WITHOUT CONTRAST  Technique:  Multidetector CT imaging of the abdomen and pelvis was performed following the standard protocol without intravenous contrast.  Comparison: 11/13/2012  Findings: Tiny 1-2 mm nonobstructing calculi are noted in the upper and lower poles of left kidney.  No evidence of hydronephrosis.  No evidence of ureteral calculi or dilatation.  No bladder calculi identified.  The other abdominal parenchymal organs are unremarkable appearance on this noncontrast study.  Gallbladder is unremarkable.  No soft tissue masses or lymphadenopathy identified.  No evidence of inflammatory process or abnormal fluid collections.  No evidence of dilated bowel loops or hernia.  Surgical staples noted right pelvis.  Previously seen free fluid in the pelvis is no longer visualized.  IMPRESSION:  Tiny nonobstructing left renal calculi.  No evidence of ureteral calculi, hydronephrosis, or other acute findings.   Original Report Authenticated By: Myles Rosenthal, M.D.      1. Liver function abnormality   2. Left flank pain, chronic       MDM   Pt has been seen multiple times for similar complaints with multiple CT at [facility]  and [facility]. Pt has persistent non-obstructive L renal stone seen on previous CT's. Doubt pt's symptoms due to renal colic. In the work up, pt found to have elevated LFT's and on further questioning admits to excessive Tylenol use.   Pt discussed with Dr Christella Hartigan who recommends f/u next week, avoidance of tylenol and sent hep panel and ANA.       Loren Racer, MD 12/27/12 1504

## 2012-12-29 ENCOUNTER — Telehealth (HOSPITAL_COMMUNITY): Payer: Self-pay | Admitting: Emergency Medicine

## 2013-03-17 ENCOUNTER — Emergency Department (HOSPITAL_BASED_OUTPATIENT_CLINIC_OR_DEPARTMENT_OTHER): Payer: Medicaid Other

## 2013-03-17 ENCOUNTER — Emergency Department (HOSPITAL_BASED_OUTPATIENT_CLINIC_OR_DEPARTMENT_OTHER)
Admission: EM | Admit: 2013-03-17 | Discharge: 2013-03-17 | Disposition: A | Discharge status: Home / Self Care | Payer: Medicaid Other | Attending: Emergency Medicine | Admitting: Emergency Medicine

## 2013-03-17 ENCOUNTER — Encounter (HOSPITAL_BASED_OUTPATIENT_CLINIC_OR_DEPARTMENT_OTHER): Payer: Self-pay | Admitting: *Deleted

## 2013-03-17 DIAGNOSIS — Z21 Asymptomatic human immunodeficiency virus [HIV] infection status: Secondary | ICD-10-CM | POA: Insufficient documentation

## 2013-03-17 DIAGNOSIS — Z880 Allergy status to penicillin: Secondary | ICD-10-CM | POA: Insufficient documentation

## 2013-03-17 DIAGNOSIS — Z8719 Personal history of other diseases of the digestive system: Secondary | ICD-10-CM | POA: Insufficient documentation

## 2013-03-17 DIAGNOSIS — S62639A Displaced fracture of distal phalanx of unspecified finger, initial encounter for closed fracture: Secondary | ICD-10-CM | POA: Insufficient documentation

## 2013-03-17 DIAGNOSIS — Z87442 Personal history of urinary calculi: Secondary | ICD-10-CM | POA: Insufficient documentation

## 2013-03-17 DIAGNOSIS — F17200 Nicotine dependence, unspecified, uncomplicated: Secondary | ICD-10-CM | POA: Insufficient documentation

## 2013-03-17 DIAGNOSIS — F411 Generalized anxiety disorder: Secondary | ICD-10-CM | POA: Insufficient documentation

## 2013-03-17 DIAGNOSIS — G40909 Epilepsy, unspecified, not intractable, without status epilepticus: Secondary | ICD-10-CM | POA: Insufficient documentation

## 2013-03-17 DIAGNOSIS — Z8742 Personal history of other diseases of the female genital tract: Secondary | ICD-10-CM | POA: Insufficient documentation

## 2013-03-17 DIAGNOSIS — Z79899 Other long term (current) drug therapy: Secondary | ICD-10-CM | POA: Insufficient documentation

## 2013-03-17 MED ORDER — HYDROCODONE-ACETAMINOPHEN 5-325 MG PO TABS: 2.0000 | ORAL_TABLET | Freq: Three times a day (TID) | ORAL | Status: DC | PRN

## 2013-03-17 MED ORDER — IBUPROFEN 400 MG PO TABS: 400.0000 mg | ORAL_TABLET | Freq: Once | ORAL | Status: DC
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
## 2013-03-26 ENCOUNTER — Encounter (HOSPITAL_BASED_OUTPATIENT_CLINIC_OR_DEPARTMENT_OTHER): Payer: Self-pay | Admitting: *Deleted

## 2013-03-26 ENCOUNTER — Emergency Department (HOSPITAL_BASED_OUTPATIENT_CLINIC_OR_DEPARTMENT_OTHER): Payer: Medicaid Other

## 2013-03-26 ENCOUNTER — Emergency Department (HOSPITAL_BASED_OUTPATIENT_CLINIC_OR_DEPARTMENT_OTHER)
Admission: EM | Admit: 2013-03-26 | Discharge: 2013-03-26 | Disposition: A | Discharge status: Home / Self Care | Payer: Medicaid Other | Attending: Emergency Medicine | Admitting: Emergency Medicine

## 2013-03-26 DIAGNOSIS — R109 Unspecified abdominal pain: Secondary | ICD-10-CM

## 2013-03-26 DIAGNOSIS — F411 Generalized anxiety disorder: Secondary | ICD-10-CM | POA: Insufficient documentation

## 2013-03-26 DIAGNOSIS — Z880 Allergy status to penicillin: Secondary | ICD-10-CM | POA: Insufficient documentation

## 2013-03-26 DIAGNOSIS — Z7982 Long term (current) use of aspirin: Secondary | ICD-10-CM | POA: Insufficient documentation

## 2013-03-26 DIAGNOSIS — Z87442 Personal history of urinary calculi: Secondary | ICD-10-CM | POA: Insufficient documentation

## 2013-03-26 DIAGNOSIS — Z8742 Personal history of other diseases of the female genital tract: Secondary | ICD-10-CM | POA: Insufficient documentation

## 2013-03-26 DIAGNOSIS — Z8719 Personal history of other diseases of the digestive system: Secondary | ICD-10-CM | POA: Insufficient documentation

## 2013-03-26 DIAGNOSIS — Z21 Asymptomatic human immunodeficiency virus [HIV] infection status: Secondary | ICD-10-CM | POA: Insufficient documentation

## 2013-03-26 DIAGNOSIS — R319 Hematuria, unspecified: Secondary | ICD-10-CM | POA: Insufficient documentation

## 2013-03-26 DIAGNOSIS — F17200 Nicotine dependence, unspecified, uncomplicated: Secondary | ICD-10-CM | POA: Insufficient documentation

## 2013-03-26 DIAGNOSIS — Z79899 Other long term (current) drug therapy: Secondary | ICD-10-CM | POA: Insufficient documentation

## 2013-03-26 DIAGNOSIS — Z8669 Personal history of other diseases of the nervous system and sense organs: Secondary | ICD-10-CM | POA: Insufficient documentation

## 2013-03-26 LAB — CBC
HCT: 41.8 % (ref 36.0–46.0)
MCH: 31.3 pg (ref 26.0–34.0)
MCHC: 36.1 g/dL — ABNORMAL HIGH (ref 30.0–36.0)
RDW: 11.9 % (ref 11.5–15.5)

## 2013-03-26 LAB — URINALYSIS, ROUTINE W REFLEX MICROSCOPIC
Glucose, UA: NEGATIVE mg/dL
Protein, ur: NEGATIVE mg/dL
pH: 7 (ref 5.0–8.0)

## 2013-03-26 LAB — BASIC METABOLIC PANEL
BUN: 9 mg/dL (ref 6–23)
Calcium: 10.1 mg/dL (ref 8.4–10.5)
GFR calc Af Amer: >90 mL/min (ref >90)
GFR calc non Af Amer: 86 mL/min — ABNORMAL LOW (ref >90)
Potassium: 4.2 mEq/L (ref 3.5–5.1)

## 2013-03-26 LAB — URINE MICROSCOPIC-ADD ON

## 2013-03-26 MED ORDER — ONDANSETRON HCL 4 MG/2ML IJ SOLN
4.0000 mg | Freq: Once | INTRAMUSCULAR | Status: AC
  Administered 2013-03-26: 4 mg via INTRAVENOUS
  Filled 2013-03-26: qty 2

## 2013-03-26 MED ORDER — HYDROMORPHONE HCL PF 1 MG/ML IJ SOLN
1.0000 mg | Freq: Once | INTRAMUSCULAR | Status: AC
  Administered 2013-03-26: 1 mg via INTRAVENOUS
  Filled 2013-03-26: qty 1

## 2013-03-26 MED ORDER — HYDROCODONE-ACETAMINOPHEN 5-325 MG PO TABS: 1.0000 | ORAL_TABLET | Freq: Four times a day (QID) | ORAL | Status: DC | PRN

## 2013-03-26 NOTE — ED Notes (Signed)
Hematuria and back pain.  
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
## 2013-04-19 ENCOUNTER — Emergency Department (HOSPITAL_BASED_OUTPATIENT_CLINIC_OR_DEPARTMENT_OTHER)
Admission: EM | Admit: 2013-04-19 | Discharge: 2013-04-19 | Disposition: A | Discharge status: Home / Self Care | Payer: Medicaid Other | Attending: Emergency Medicine | Admitting: Emergency Medicine

## 2013-04-19 ENCOUNTER — Encounter (HOSPITAL_BASED_OUTPATIENT_CLINIC_OR_DEPARTMENT_OTHER): Payer: Self-pay | Admitting: Family Medicine

## 2013-04-19 DIAGNOSIS — Z21 Asymptomatic human immunodeficiency virus [HIV] infection status: Secondary | ICD-10-CM | POA: Insufficient documentation

## 2013-04-19 DIAGNOSIS — Z79899 Other long term (current) drug therapy: Secondary | ICD-10-CM | POA: Insufficient documentation

## 2013-04-19 DIAGNOSIS — K0889 Other specified disorders of teeth and supporting structures: Secondary | ICD-10-CM

## 2013-04-19 DIAGNOSIS — Z8669 Personal history of other diseases of the nervous system and sense organs: Secondary | ICD-10-CM | POA: Insufficient documentation

## 2013-04-19 DIAGNOSIS — K089 Disorder of teeth and supporting structures, unspecified: Secondary | ICD-10-CM | POA: Insufficient documentation

## 2013-04-19 DIAGNOSIS — F411 Generalized anxiety disorder: Secondary | ICD-10-CM | POA: Insufficient documentation

## 2013-04-19 DIAGNOSIS — Z8719 Personal history of other diseases of the digestive system: Secondary | ICD-10-CM | POA: Insufficient documentation

## 2013-04-19 DIAGNOSIS — R51 Headache: Secondary | ICD-10-CM | POA: Insufficient documentation

## 2013-04-19 DIAGNOSIS — F17200 Nicotine dependence, unspecified, uncomplicated: Secondary | ICD-10-CM | POA: Insufficient documentation

## 2013-04-19 DIAGNOSIS — Z8742 Personal history of other diseases of the female genital tract: Secondary | ICD-10-CM | POA: Insufficient documentation

## 2013-04-19 DIAGNOSIS — Z880 Allergy status to penicillin: Secondary | ICD-10-CM | POA: Insufficient documentation

## 2013-04-19 DIAGNOSIS — Z87442 Personal history of urinary calculi: Secondary | ICD-10-CM | POA: Insufficient documentation

## 2013-04-19 NOTE — ED Notes (Signed)
Pt c/o right lower dental pain since 4am. Pt sts she had same pain last week and took a course of clindamycin for same.
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
## 2013-04-27 ENCOUNTER — Emergency Department (HOSPITAL_BASED_OUTPATIENT_CLINIC_OR_DEPARTMENT_OTHER)
Admission: EM | Admit: 2013-04-27 | Discharge: 2013-04-27 | Disposition: A | Discharge status: Home / Self Care | Payer: Medicaid Other | Attending: Emergency Medicine | Admitting: Emergency Medicine

## 2013-04-27 ENCOUNTER — Emergency Department (HOSPITAL_BASED_OUTPATIENT_CLINIC_OR_DEPARTMENT_OTHER): Payer: Medicaid Other

## 2013-04-27 ENCOUNTER — Encounter (HOSPITAL_BASED_OUTPATIENT_CLINIC_OR_DEPARTMENT_OTHER): Payer: Self-pay | Admitting: Emergency Medicine

## 2013-04-27 DIAGNOSIS — R609 Edema, unspecified: Secondary | ICD-10-CM | POA: Insufficient documentation

## 2013-04-27 DIAGNOSIS — Y929 Unspecified place or not applicable: Secondary | ICD-10-CM | POA: Insufficient documentation

## 2013-04-27 DIAGNOSIS — F17200 Nicotine dependence, unspecified, uncomplicated: Secondary | ICD-10-CM | POA: Insufficient documentation

## 2013-04-27 DIAGNOSIS — Z87442 Personal history of urinary calculi: Secondary | ICD-10-CM | POA: Insufficient documentation

## 2013-04-27 DIAGNOSIS — Y9389 Activity, other specified: Secondary | ICD-10-CM | POA: Insufficient documentation

## 2013-04-27 DIAGNOSIS — F411 Generalized anxiety disorder: Secondary | ICD-10-CM | POA: Insufficient documentation

## 2013-04-27 DIAGNOSIS — Z8742 Personal history of other diseases of the female genital tract: Secondary | ICD-10-CM | POA: Insufficient documentation

## 2013-04-27 DIAGNOSIS — S6991XA Unspecified injury of right wrist, hand and finger(s), initial encounter: Secondary | ICD-10-CM

## 2013-04-27 DIAGNOSIS — Z880 Allergy status to penicillin: Secondary | ICD-10-CM | POA: Insufficient documentation

## 2013-04-27 DIAGNOSIS — Z79899 Other long term (current) drug therapy: Secondary | ICD-10-CM | POA: Insufficient documentation

## 2013-04-27 DIAGNOSIS — S6990XA Unspecified injury of unspecified wrist, hand and finger(s), initial encounter: Secondary | ICD-10-CM | POA: Insufficient documentation

## 2013-04-27 DIAGNOSIS — Z21 Asymptomatic human immunodeficiency virus [HIV] infection status: Secondary | ICD-10-CM | POA: Insufficient documentation

## 2013-04-27 MED ORDER — HYDROCODONE-ACETAMINOPHEN 5-325 MG PO TABS: 2.0000 | ORAL_TABLET | Freq: Once | ORAL | Status: DC
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
## 2013-04-27 NOTE — ED Notes (Addendum)
Crystal from registration reports that patient refused to sign consent for treatment during registration when she first arrived because she "couldn't get out of her wheelchair". 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

## 2013-05-05 ENCOUNTER — Emergency Department (HOSPITAL_COMMUNITY): Payer: Medicaid Other

## 2013-05-05 ENCOUNTER — Emergency Department (HOSPITAL_COMMUNITY)
Admission: EM | Admit: 2013-05-05 | Discharge: 2013-05-05 | Disposition: A | Discharge status: Home / Self Care | Payer: Medicaid Other | Attending: Emergency Medicine | Admitting: Emergency Medicine

## 2013-05-05 ENCOUNTER — Encounter (HOSPITAL_COMMUNITY): Payer: Self-pay | Admitting: Emergency Medicine

## 2013-05-05 DIAGNOSIS — R110 Nausea: Secondary | ICD-10-CM | POA: Insufficient documentation

## 2013-05-05 DIAGNOSIS — R109 Unspecified abdominal pain: Secondary | ICD-10-CM

## 2013-05-05 DIAGNOSIS — F17200 Nicotine dependence, unspecified, uncomplicated: Secondary | ICD-10-CM | POA: Insufficient documentation

## 2013-05-05 DIAGNOSIS — F411 Generalized anxiety disorder: Secondary | ICD-10-CM | POA: Insufficient documentation

## 2013-05-05 DIAGNOSIS — Z880 Allergy status to penicillin: Secondary | ICD-10-CM | POA: Insufficient documentation

## 2013-05-05 DIAGNOSIS — Z8742 Personal history of other diseases of the female genital tract: Secondary | ICD-10-CM | POA: Insufficient documentation

## 2013-05-05 DIAGNOSIS — Z21 Asymptomatic human immunodeficiency virus [HIV] infection status: Secondary | ICD-10-CM | POA: Insufficient documentation

## 2013-05-05 DIAGNOSIS — Z87442 Personal history of urinary calculi: Secondary | ICD-10-CM | POA: Insufficient documentation

## 2013-05-05 DIAGNOSIS — R319 Hematuria, unspecified: Secondary | ICD-10-CM

## 2013-05-05 DIAGNOSIS — Z79899 Other long term (current) drug therapy: Secondary | ICD-10-CM | POA: Insufficient documentation

## 2013-05-05 DIAGNOSIS — Z3202 Encounter for pregnancy test, result negative: Secondary | ICD-10-CM | POA: Insufficient documentation

## 2013-05-05 DIAGNOSIS — N390 Urinary tract infection, site not specified: Secondary | ICD-10-CM

## 2013-05-05 DIAGNOSIS — Z8719 Personal history of other diseases of the digestive system: Secondary | ICD-10-CM | POA: Insufficient documentation

## 2013-05-05 DIAGNOSIS — Z8669 Personal history of other diseases of the nervous system and sense organs: Secondary | ICD-10-CM | POA: Insufficient documentation

## 2013-05-05 LAB — URINE MICROSCOPIC-ADD ON

## 2013-05-05 LAB — URINALYSIS, ROUTINE W REFLEX MICROSCOPIC
Bilirubin Urine: NEGATIVE
Nitrite: NEGATIVE
Specific Gravity, Urine: 1.02 (ref 1.005–1.030)
pH: 6 (ref 5.0–8.0)

## 2013-05-05 LAB — POCT PREGNANCY, URINE: Preg Test, Ur: NEGATIVE

## 2013-05-05 MED ORDER — KETOROLAC TROMETHAMINE 60 MG/2ML IM SOLN
60.0000 mg | Freq: Once | INTRAMUSCULAR | Status: AC
  Administered 2013-05-05: 60 mg via INTRAMUSCULAR
  Filled 2013-05-05: qty 2

## 2013-05-05 MED ORDER — CIPROFLOXACIN HCL 500 MG PO TABS
500.0000 mg | ORAL_TABLET | Freq: Once | ORAL | Status: AC
  Administered 2013-05-05: 500 mg via ORAL
  Filled 2013-05-05: qty 1

## 2013-05-05 MED ORDER — ONDANSETRON HCL 4 MG/2ML IJ SOLN
4.0000 mg | Freq: Once | INTRAMUSCULAR | Status: AC
Start: 2013-05-05 — End: 2013-05-05
  Administered 2013-05-05: 4 mg via INTRAVENOUS
  Filled 2013-05-05: qty 2

## 2013-05-05 MED ORDER — CIPROFLOXACIN HCL 500 MG PO TABS: 500.0000 mg | ORAL_TABLET | Freq: Two times a day (BID) | ORAL | Status: DC

## 2013-05-05 MED ORDER — HYDROMORPHONE HCL PF 1 MG/ML IJ SOLN
1.0000 mg | Freq: Once | INTRAMUSCULAR | Status: AC
  Administered 2013-05-05: 1 mg via INTRAVENOUS
  Filled 2013-05-05: qty 1

## 2013-05-05 NOTE — ED Provider Notes (Signed)
CSN: [id number]     Arrival date & time 05/05/13  1900 History     First MD Initiated Contact with Patient 05/05/13 1909     No chief complaint on file.  (Consider location/radiation/quality/duration/timing/severity/associated sxs/prior Treatment) HPI  27 year old female with history of HIV, kidney stones, ovarian cyst, presents complaining of flank pain.  Patient reports the onset of left leg pain started about 40 minutes ago. Pain is sharp stabbing felt similar to prior kidney stones. Patient felt nauseated without vomiting. Nothing seems to make pain better or worse. No specific treatment tried. Denies any fever, chills, headache, chest pain, shortness of breath, abdominal pain, dysuria, hematuria, vaginal discharge, or rash. States her HIV is under control with medication. No prior history of stenting radiating to her kidney stone.    Past Medical History  Diagnosis Date  . HIV (human immunodeficiency virus infection)   . TMJ syndrome   . Simple seizure     r/t taking tramadol  . Ovarian cyst   . Kidney stone   . Anxiety    Past Surgical History  Procedure Laterality Date  . Appendectomy    . Cesarean section    . Ankle surgery    . Tubal ligation    . Fracture surgery    . Lithotripsy    . Abdominal hysterectomy     No family history on file. History  Substance Use Topics  . Smoking status: Current Every Day Smoker -- 0.50 packs/day    Types: Cigarettes  . Smokeless tobacco: Never Used  . Alcohol Use: No   OB History   Grav Para Term Preterm Abortions TAB SAB Ect Mult Living                 Review of Systems  Constitutional: Negative for fever.  Genitourinary: Positive for flank pain. Negative for dysuria and hematuria.  Skin: Negative for rash.    Allergies  Penicillins; Naproxen; and Tramadol  Home Medications   Current Outpatient Rx  Name  Route  Sig  Dispense  Refill  . ALPRAZolam (XANAX) 0.5 MG tablet   Oral   Take 0.5 mg by mouth 2 (two) times  daily.          . Aspirin-Acetaminophen-Caffeine (GOODY HEADACHE PO)   Oral   Take 1 packet by mouth daily as needed. For pain         . clindamycin (CLEOCIN) 150 MG capsule   Oral   Take 1 capsule (150 mg total) by mouth every 6 (six) hours.   28 capsule   0   . Darunavir Ethanolate (PREZISTA PO)   Oral   Take 1 tablet by mouth daily.         Marland Kitchen emtricitabine-tenofovir (TRUVADA) 200-300 MG per tablet   Oral   Take 1 tablet by mouth 2 (two) times daily. HIV medication.  Consult needed.         Marland Kitchen HYDROcodone-acetaminophen (NORCO) 5-325 MG per tablet   Oral   Take 2 tablets by mouth every 8 (eight) hours as needed for pain.   15 tablet   0   . HYDROcodone-acetaminophen (NORCO/VICODIN) 5-325 MG per tablet   Oral   Take 1 tablet by mouth every 4 (four) hours as needed.   20 tablet   0   . HYDROcodone-acetaminophen (NORCO/VICODIN) 5-325 MG per tablet   Oral   Take 1 tablet by mouth every 6 (six) hours as needed for pain.   5 tablet   0   .  ondansetron (ZOFRAN ODT) 4 MG disintegrating tablet      4mg  ODT q4 hours prn nausea/vomit   8 tablet   0   . ritonavir (NORVIR) 100 MG capsule   Oral   Take 100 mg by mouth 2 (two) times daily. HIV medication.  Consult needed.          BP 126/74  Pulse 103  Temp(Src) 98.6 F (37 C) (Oral)  Resp 20  SpO2 98%  LMP 06/25/2012 Physical Exam  Nursing note and vitals reviewed. Constitutional: She is oriented to person, place, and time.  Well-appearing female who appears uncomfortable.  HENT:  Head: Normocephalic and atraumatic.  Eyes: Conjunctivae are normal.  Neck: Neck supple.  Cardiovascular: Normal rate and regular rhythm.   Pulmonary/Chest: Effort normal and breath sounds normal.  Abdominal: Soft. There is no tenderness.  No hernia noted  Genitourinary:  Left CVA tenderness  Musculoskeletal: She exhibits no edema.  Neurological: She is alert and oriented to person, place, and time.  Skin: No rash noted.   Psychiatric: She has a normal mood and affect.    ED Course   Procedures (including critical care time)  Patient presents complaining of left flank pain, similar to her prior kidney stone. After reviewing the chart, it was noted that patient has drug-seeking behavior. She has had 3 abdominal and pelvis CT scan to evaluate for stones each shown evidence of a tiny nonobstructing stone to her left kidney. My plan is to obtain UA, pregnancy test, and may obtain KUB as needed. Will give patient Toradol for pain.  9:08 PM UA is positive for hemoglobin however this is similar to multiple prior UA. There is 7-10 white blood cells and UA and a bacteria however there are also many squamous epithelial cells. KUB shows no evidence to suggest any significant kidney stone noted. Urine culture sent.  9:47 PM Pt discharge with recommendation to f/u with urologist for persistent hematuria.  Return precaution discussed.    Labs Reviewed  URINALYSIS, ROUTINE W REFLEX MICROSCOPIC - Abnormal; Notable for the following:    APPearance CLOUDY (*)    Hgb urine dipstick LARGE (*)    Leukocytes, UA MODERATE (*)    All other components within normal limits  URINE MICROSCOPIC-ADD ON - Abnormal; Notable for the following:    Squamous Epithelial / LPF MANY (*)    Bacteria, UA MANY (*)    All other components within normal limits  URINE CULTURE  POCT PREGNANCY, URINE   Dg Abd 1 View  05/05/2013   *RADIOLOGY REPORT*  Clinical Data: Left flank pain.  History of kidney stones.  ABDOMEN - 1 VIEW  Comparison: CT 03/26/2013  Findings: The previously seen tiny left upper pole renal stone by CT would not be visible by plain film.  No visible renal or ureteral stones.  Normal bowel gas pattern.  No free air organomegaly.  No acute bony abnormality.  IMPRESSION: No acute findings.   Original Report Authenticated By: Charlett Nose, M.D.   1. UTI (lower urinary tract infection)   2. Left flank pain   3. Hematuria     MDM   BP 126/74  Pulse 103  Temp(Src) 98.6 F (37 C) (Oral)  Resp 20  SpO2 98%  LMP 06/25/2012  I have reviewed nursing notes and vital signs. I personally reviewed the imaging tests through PACS system  I reviewed available ER/hospitalization records thought the EMR   Fayrene Helper, [place] 05/05/13 2149

## 2013-05-05 NOTE — ED Provider Notes (Signed)
Medical screening examination/treatment/procedure(s) were performed by non-physician practitioner and as supervising physician I was immediately available for consultation/collaboration.  Shon Baton, MD 05/05/13 2325

## 2013-05-05 NOTE — ED Notes (Signed)
Pt from home reports sudden onset severe L flank pain. Pt reports hx of kidney stones. Pt nauseous but no vomiting. Pt denies SOB/CP. Pt in NAD and A&O

## 2013-05-08 LAB — URINE CULTURE

## 2013-05-09 NOTE — ED Notes (Signed)
+   Urine Culture- treated with appropriate medication per protocol MD. 

## 2013-05-26 ENCOUNTER — Emergency Department (HOSPITAL_BASED_OUTPATIENT_CLINIC_OR_DEPARTMENT_OTHER)
Admission: EM | Admit: 2013-05-26 | Discharge: 2013-05-26 | Disposition: A | Discharge status: Home / Self Care | Payer: Medicaid Other | Attending: Emergency Medicine | Admitting: Emergency Medicine

## 2013-05-26 ENCOUNTER — Emergency Department (HOSPITAL_BASED_OUTPATIENT_CLINIC_OR_DEPARTMENT_OTHER): Payer: Medicaid Other

## 2013-05-26 ENCOUNTER — Encounter (HOSPITAL_BASED_OUTPATIENT_CLINIC_OR_DEPARTMENT_OTHER): Payer: Self-pay | Admitting: Emergency Medicine

## 2013-05-26 DIAGNOSIS — Z8659 Personal history of other mental and behavioral disorders: Secondary | ICD-10-CM | POA: Insufficient documentation

## 2013-05-26 DIAGNOSIS — Z8742 Personal history of other diseases of the female genital tract: Secondary | ICD-10-CM | POA: Insufficient documentation

## 2013-05-26 DIAGNOSIS — Z21 Asymptomatic human immunodeficiency virus [HIV] infection status: Secondary | ICD-10-CM | POA: Insufficient documentation

## 2013-05-26 DIAGNOSIS — R059 Cough, unspecified: Secondary | ICD-10-CM | POA: Insufficient documentation

## 2013-05-26 DIAGNOSIS — Z79899 Other long term (current) drug therapy: Secondary | ICD-10-CM | POA: Insufficient documentation

## 2013-05-26 DIAGNOSIS — Z8669 Personal history of other diseases of the nervous system and sense organs: Secondary | ICD-10-CM | POA: Insufficient documentation

## 2013-05-26 DIAGNOSIS — R05 Cough: Secondary | ICD-10-CM | POA: Insufficient documentation

## 2013-05-26 DIAGNOSIS — J069 Acute upper respiratory infection, unspecified: Secondary | ICD-10-CM | POA: Insufficient documentation

## 2013-05-26 DIAGNOSIS — R51 Headache: Secondary | ICD-10-CM | POA: Insufficient documentation

## 2013-05-26 DIAGNOSIS — Z8719 Personal history of other diseases of the digestive system: Secondary | ICD-10-CM | POA: Insufficient documentation

## 2013-05-26 DIAGNOSIS — Z87442 Personal history of urinary calculi: Secondary | ICD-10-CM | POA: Insufficient documentation

## 2013-05-26 DIAGNOSIS — F17200 Nicotine dependence, unspecified, uncomplicated: Secondary | ICD-10-CM | POA: Insufficient documentation

## 2013-05-26 DIAGNOSIS — J029 Acute pharyngitis, unspecified: Secondary | ICD-10-CM | POA: Insufficient documentation

## 2013-05-26 DIAGNOSIS — Z880 Allergy status to penicillin: Secondary | ICD-10-CM | POA: Insufficient documentation

## 2013-05-26 DIAGNOSIS — H9209 Otalgia, unspecified ear: Secondary | ICD-10-CM | POA: Insufficient documentation

## 2013-05-26 HISTORY — DX: Unspecified convulsions: R56.9

## 2013-05-26 MED ORDER — BENZONATATE 100 MG PO CAPS: 100.0000 mg | ORAL_CAPSULE | Freq: Three times a day (TID) | ORAL | Status: DC

## 2013-05-26 NOTE — ED Provider Notes (Signed)
CSN: [id number]     Arrival date & time 05/26/13  2138 History    Chief Complaint  Patient presents with  . Cough  . Headache  . Otalgia   (Consider location/radiation/quality/duration/timing/severity/associated sxs/prior Treatment) HPI Comments: Patient is a 27 year old female, well-known to the emergency department, who presents for sore throat x2 days. Symptoms are associated with a dry, nonproductive cough, nasal congestion, and bilateral ear pressure. Patient admits to sick contacts and states her nephew who is sick with similar symptoms. She has tried DayQuil for symptoms without relief. She denies any alleviating factors of her symptoms. Patient further denies associated fever, eye pain or redness, ear discharge, inability to swallow, drooling, hemoptysis, neck pain or stiffness, shortness of breath, and nausea or vomiting.  The history is provided by the patient. No language interpreter was used.     Past Medical History  Diagnosis Date  . HIV (human immunodeficiency virus infection)   . TMJ syndrome   . Simple seizure     r/t taking tramadol  . Ovarian cyst   . Kidney stone   . Anxiety   . Seizures    Past Surgical History  Procedure Laterality Date  . Appendectomy    . Cesarean section    . Ankle surgery    . Tubal ligation    . Fracture surgery    . Lithotripsy    . Abdominal hysterectomy     History reviewed. No pertinent family history. History  Substance Use Topics  . Smoking status: Current Every Day Smoker -- 0.50 packs/day    Types: Cigarettes  . Smokeless tobacco: Never Used  . Alcohol Use: No   OB History   Grav Para Term Preterm Abortions TAB SAB Ect Mult Living                 Review of Systems  Constitutional: Negative for fever.  HENT: Positive for congestion, sore throat and sinus pressure. Negative for rhinorrhea, neck pain and neck stiffness.   Respiratory: Positive for cough. Negative for shortness of breath.   Cardiovascular: Negative  for chest pain.  Gastrointestinal: Negative for nausea and vomiting.  All other systems reviewed and are negative.   Allergies  Penicillins; Naproxen; and Tramadol  Home Medications   Current Outpatient Rx  Name  Route  Sig  Dispense  Refill  . Aspirin-Acetaminophen-Caffeine (GOODY HEADACHE PO)   Oral   Take 1 packet by mouth daily as needed. For pain         . benzonatate (TESSALON) 100 MG capsule   Oral   Take 1 capsule (100 mg total) by mouth every 8 (eight) hours.   21 capsule   0   . Darunavir Ethanolate (PREZISTA PO)   Oral   Take 1 tablet by mouth daily.         Marland Kitchen emtricitabine-tenofovir (TRUVADA) 200-300 MG per tablet   Oral   Take 1 tablet by mouth 2 (two) times daily. HIV medication.  Consult needed.         . ritonavir (NORVIR) 100 MG capsule   Oral   Take 100 mg by mouth 2 (two) times daily. HIV medication.  Consult needed.          BP 137/88  Pulse 75  Temp(Src) 98.7 F (37.1 C) (Oral)  Resp 16  Ht 5\' 5"  (1.651 m)  Wt 140 lb (63.504 kg)  BMI 23.3 kg/m2  SpO2 100%  LMP 06/25/2012  Physical Exam  Nursing note and vitals reviewed.  Constitutional: She is oriented to person, place, and time. She appears well-developed and well-nourished. No distress.  HENT:  Head: Normocephalic and atraumatic.  Right Ear: Tympanic membrane, external ear and ear canal normal. No mastoid tenderness.  Left Ear: Tympanic membrane, external ear and ear canal normal. No mastoid tenderness.  Nose: Nose normal. Right sinus exhibits no maxillary sinus tenderness and no frontal sinus tenderness. Left sinus exhibits no maxillary sinus tenderness and no frontal sinus tenderness.  Mouth/Throat: Uvula is midline, oropharynx is clear and moist and mucous membranes are normal.  Uvula midline. Airway patent and patient tolerating secretions without difficulty. There is mild posterior oropharyngeal erythema. No posterior or pharyngeal edema. No tonsillar enlargement or exudates.   Eyes: Conjunctivae and EOM are normal. Pupils are equal, round, and reactive to light. Right eye exhibits no discharge. Left eye exhibits no discharge. No scleral icterus.  Neck: Normal range of motion. Neck supple.  Patient moves neck with ease. No nuchal rigidity or meningeal signs  Cardiovascular: Normal rate, regular rhythm and normal heart sounds.   Pulmonary/Chest: Effort normal and breath sounds normal. No stridor. No respiratory distress. She has no wheezes. She has no rales.  No retractions or accessory muscle use.  Musculoskeletal: Normal range of motion.  Lymphadenopathy:    She has no cervical adenopathy.  Neurological: She is alert and oriented to person, place, and time.  Skin: Skin is warm and dry. No rash noted. She is not diaphoretic. No erythema. No pallor.  Psychiatric: She has a normal mood and affect. Her behavior is normal.    ED Course  Procedures (including critical care time)  Labs Reviewed - No data to display  Imaging Review Dg Chest 2 View  05/26/2013   *RADIOLOGY REPORT*  Clinical Data: Cough.  Headache.  Otalgia.  Current smoker.  CHEST - 2 VIEW  Comparison: 10/04/2012  Findings: The heart size and pulmonary vascularity are normal. The lungs appear clear and expanded without focal air space disease or consolidation. No blunting of the costophrenic angles.  No pneumothorax.  Mediastinal contours appear intact.  IMPRESSION: No evidence of active pulmonary disease.   Original Report Authenticated By: Burman Nieves, M.D.    MDM   1. Viral URI with cough    27 year old female who presents for upper respiratory symptoms with cough x2 days. Patient is well and nontoxic appearing, hemodynamically stable, and afebrile. Lungs clear to auscultation bilaterally on exam without accessory muscle use or retractions. Uvula midline without evidence of peritonsillar abscess. Airway patent and patient tolerating secretions without difficulty. There is no nuchal rigidity or  meningeal signs on physical exam. No mastoid tenderness or posterior auricular erythema, swelling, or heat-to-touch. CXR without evidence of bronchitis or pneumonia; no active cardiopulmonary disease. Patient appropriate for d/c with PCP follow up for further evaluation of symptoms. OTC decongestants and tylenol/ibuprofen recommended. Tessalon prescribed for cough. Return precautions discussed and patient agreeable to plan with no unaddressed concerns.  Antony Madura, PA-C 05/26/13 2352

## 2013-05-26 NOTE — ED Notes (Signed)
Pt having cough, congestion, ear pain, headache x 2 days.  No known fever.  Non productive cough.

## 2013-05-27 NOTE — ED Provider Notes (Signed)
Medical screening examination/treatment/procedure(s) were performed by non-physician practitioner and as supervising physician I was immediately available for consultation/collaboration.  Jasmine Awe, MD 05/27/13 [phone]

## 2013-07-15 ENCOUNTER — Encounter (HOSPITAL_BASED_OUTPATIENT_CLINIC_OR_DEPARTMENT_OTHER): Payer: Self-pay | Admitting: Emergency Medicine

## 2013-07-15 ENCOUNTER — Emergency Department (HOSPITAL_BASED_OUTPATIENT_CLINIC_OR_DEPARTMENT_OTHER)
Admission: EM | Admit: 2013-07-15 | Discharge: 2013-07-15 | Disposition: A | Discharge status: Home / Self Care | Payer: Medicaid Other | Attending: Emergency Medicine | Admitting: Emergency Medicine

## 2013-07-15 ENCOUNTER — Emergency Department (HOSPITAL_BASED_OUTPATIENT_CLINIC_OR_DEPARTMENT_OTHER): Payer: Medicaid Other

## 2013-07-15 DIAGNOSIS — Z21 Asymptomatic human immunodeficiency virus [HIV] infection status: Secondary | ICD-10-CM | POA: Insufficient documentation

## 2013-07-15 DIAGNOSIS — S5010XA Contusion of unspecified forearm, initial encounter: Secondary | ICD-10-CM | POA: Insufficient documentation

## 2013-07-15 DIAGNOSIS — Z880 Allergy status to penicillin: Secondary | ICD-10-CM | POA: Insufficient documentation

## 2013-07-15 DIAGNOSIS — Z8659 Personal history of other mental and behavioral disorders: Secondary | ICD-10-CM | POA: Insufficient documentation

## 2013-07-15 DIAGNOSIS — Z8719 Personal history of other diseases of the digestive system: Secondary | ICD-10-CM | POA: Insufficient documentation

## 2013-07-15 DIAGNOSIS — R296 Repeated falls: Secondary | ICD-10-CM | POA: Insufficient documentation

## 2013-07-15 DIAGNOSIS — Z79899 Other long term (current) drug therapy: Secondary | ICD-10-CM | POA: Insufficient documentation

## 2013-07-15 DIAGNOSIS — Y9239 Other specified sports and athletic area as the place of occurrence of the external cause: Secondary | ICD-10-CM | POA: Insufficient documentation

## 2013-07-15 DIAGNOSIS — Z87442 Personal history of urinary calculi: Secondary | ICD-10-CM | POA: Insufficient documentation

## 2013-07-15 DIAGNOSIS — Y9361 Activity, american tackle football: Secondary | ICD-10-CM | POA: Insufficient documentation

## 2013-07-15 DIAGNOSIS — F17200 Nicotine dependence, unspecified, uncomplicated: Secondary | ICD-10-CM | POA: Insufficient documentation

## 2013-07-15 DIAGNOSIS — Z8742 Personal history of other diseases of the female genital tract: Secondary | ICD-10-CM | POA: Insufficient documentation

## 2013-07-15 DIAGNOSIS — S5011XA Contusion of right forearm, initial encounter: Secondary | ICD-10-CM

## 2013-07-15 MED ORDER — OXYCODONE-ACETAMINOPHEN 5-325 MG PO TABS
1.0000 | ORAL_TABLET | Freq: Once | ORAL | Status: AC
  Administered 2013-07-15: 1 via ORAL
  Filled 2013-07-15: qty 1

## 2013-07-15 NOTE — ED Notes (Signed)
Rt forearm injury today while playing football she fell on her arm.

## 2013-07-15 NOTE — ED Provider Notes (Signed)
CSN: [id number]     Arrival date & time 07/15/13  1757 History   First MD Initiated Contact with Patient 07/15/13 1841     This chart was scribed for Dawn Chick, MD by Arlan Organ, ED Scribe. This patient was seen in room MH03/[facility]03 and the patient's care was started 7:37 PM.   Chief Complaint  Patient presents with  . Arm Injury   Patient is a 27 y.o. female presenting with arm injury. The history is provided by the patient. No language interpreter was used.  Arm Injury Location:  Arm Time since incident:  2 hours Injury: yes   Mechanism of injury: fall   Fall:    Fall occurred:  Running   Impact surface:  Grass and gravel Arm location:  R forearm Pain details:    Quality:  Throbbing   Radiates to:  Does not radiate   Severity:  Severe   Onset quality:  Gradual   Duration:  2 hours   Timing:  Constant   Progression:  Worsening Chronicity:  New Dislocation: no   Prior injury to area:  No Relieved by:  None tried Worsened by:  Movement Ineffective treatments:  None tried  HPI Comments: Dawn Velasquez is a 27 y.o. female who presents to the Emergency Department complaining of right forearm pain that started today around 5 PM. Pt states she was playing football and fell directly on the side of her arm. She states her arm is hot to the touch, and moving her fingers worsens the pain.  Past Medical History  Diagnosis Date  . HIV (human immunodeficiency virus infection)   . TMJ syndrome   . Simple seizure     r/t taking tramadol  . Ovarian cyst   . Kidney stone   . Anxiety   . Seizures    Past Surgical History  Procedure Laterality Date  . Appendectomy    . Cesarean section    . Ankle surgery    . Tubal ligation    . Fracture surgery    . Lithotripsy    . Abdominal hysterectomy     No family history on file. History  Substance Use Topics  . Smoking status: Current Every Day Smoker -- 0.50 packs/day    Types: Cigarettes  . Smokeless tobacco: Never  Used  . Alcohol Use: No   OB History   Grav Para Term Preterm Abortions TAB SAB Ect Mult Living                 Review of Systems  Musculoskeletal: Positive for myalgias (right forearm ).  All other systems reviewed and are negative.    Allergies  Penicillins; Naproxen; and Tramadol  Home Medications   Current Outpatient Rx  Name  Route  Sig  Dispense  Refill  . Aspirin-Acetaminophen-Caffeine (GOODY HEADACHE Velasquez)   Oral   Take 1 packet by mouth daily as needed. For pain         . benzonatate (TESSALON) 100 MG capsule   Oral   Take 1 capsule (100 mg total) by mouth every 8 (eight) hours.   21 capsule   0   . Darunavir Ethanolate (PREZISTA Velasquez)   Oral   Take 1 tablet by mouth daily.         Marland Kitchen emtricitabine-tenofovir (TRUVADA) 200-300 MG per tablet   Oral   Take 1 tablet by mouth 2 (two) times daily. HIV medication.  Consult needed.         Marland Kitchen  ritonavir (NORVIR) 100 MG capsule   Oral   Take 100 mg by mouth 2 (two) times daily. HIV medication.  Consult needed.          BP 101/67  Pulse 74  Temp(Src) 98.7 F (37.1 C) (Oral)  Resp 20  Ht 5\' 5"  (1.651 m)  Wt 140 lb (63.504 kg)  BMI 23.3 kg/m2  SpO2 100%  LMP 06/25/2012  Physical Exam  Nursing note and vitals reviewed. Constitutional: She is oriented to person, place, and time. She appears well-developed and well-nourished.  HENT:  Head: Normocephalic and atraumatic.  Eyes: EOM are normal.  Neck: Normal range of motion.  Cardiovascular: Normal rate, regular rhythm and normal heart sounds.   Pulmonary/Chest: Effort normal and breath sounds normal.  Musculoskeletal: She exhibits tenderness.  Contusion on right forearm , tenderness to palpation overlying 2 plus radial pulse   Neurological: She is alert and oriented to person, place, and time.  Hand and finger NVI  Skin: Skin is warm and dry.  Psychiatric: She has a normal mood and affect. Her behavior is normal.    ED Course  Procedures (including  critical care time)  DIAGNOSTIC STUDIES: Oxygen Saturation is 100% on RA, Normal by my interpretation.    COORDINATION OF CARE: 7:39 PM- Will give pain medication. Will order X-Ray. Discussed treatment plan with pt at bedside and pt agreed to plan.     Labs Review Labs Reviewed - No data to display Imaging Review Dg Forearm Right  07/15/2013   CLINICAL DATA:  Pain post fall.  EXAM: RIGHT FOREARM - 2 VIEW  COMPARISON:  04/27/2013  FINDINGS: There is no evidence of fracture or other focal bone lesions. Soft tissues are unremarkable.  IMPRESSION: Negative.   Electronically Signed   By: Oley Balm M.D.   On: 07/15/2013 18:28    EKG Interpretation   None       MDM   1. Contusion of forearm, right, initial encounter    Pt presenting with contusion of right forearm after fall today. Xray reassuring. Images reviewed and interpreted by me as well.  Arm is NVI.  Given one dose of pain meds in the ED.  Pt is asking for stronger pain medication as well as muscle relaxer.  There are multiple documentations of drug seeking behavior in her chart as well as numerous ED visits for pain related complaints.  In the setting of normal xray i have discussed with her that we will not be prescribing narcotic pain medications for home use.  Advised ice, elevation, ibuprofen/tylenol for discomfort.  Discussed strict return precautions.   Discharged with strict return precautions.  Pt agreeable with plan.  I personally performed the services described in this documentation, which was scribed in my presence. The recorded information has been reviewed and is accurate.   Dawn Chick, MD 07/15/13 [phone]

## 2013-09-13 IMAGING — US US PELVIS COMPLETE
1 series · 13 of 25 positions shown · non-contrast
Comparison: CT abdomen and pelvis 05/28/2012 and pelvic ultrasound
02/17/2012.

CLINICAL DATA: Pelvic pain.  History of ovarian cyst. G3 P3.  LMP
06/25/2012.  Prior tubal ligation.

TRANSABDOMINAL AND TRANSVAGINAL ULTRASOUND OF PELVIS
TECHNIQUE: Both transabdominal and transvaginal ultrasound
examinations of the pelvis were performed. Transabdominal technique
was performed for global imaging of the pelvis including uterus,
ovaries, adnexal regions, and pelvic cul-de-sac.
It was necessary to proceed with endovaginal exam following the
transabdominal exam to visualize the endometrium and ovaries as the
bladder was incompletely distended..

[Series 1: us pelvis complete · 0.26mm/px · 13 of 91 slices shown]
[im 1/91]
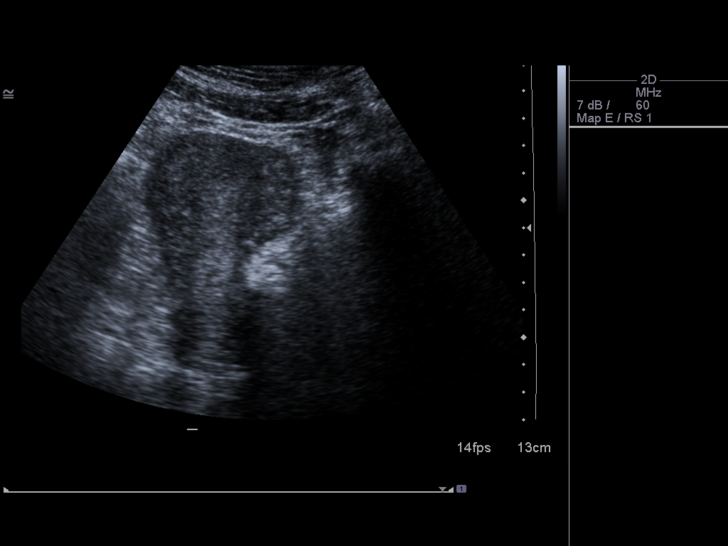
[im 8/91]
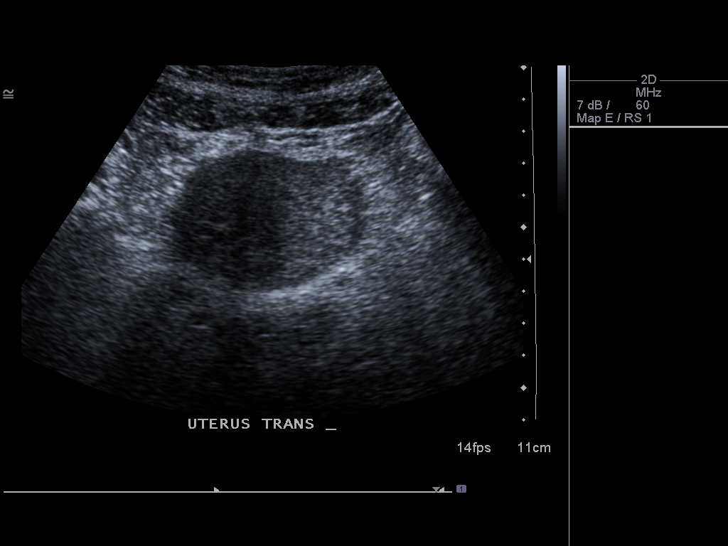
[im 16/91]
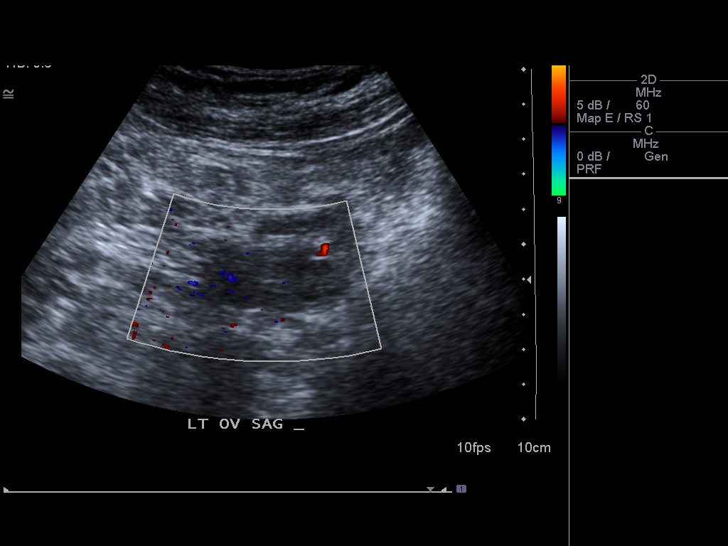
[im 23/91]
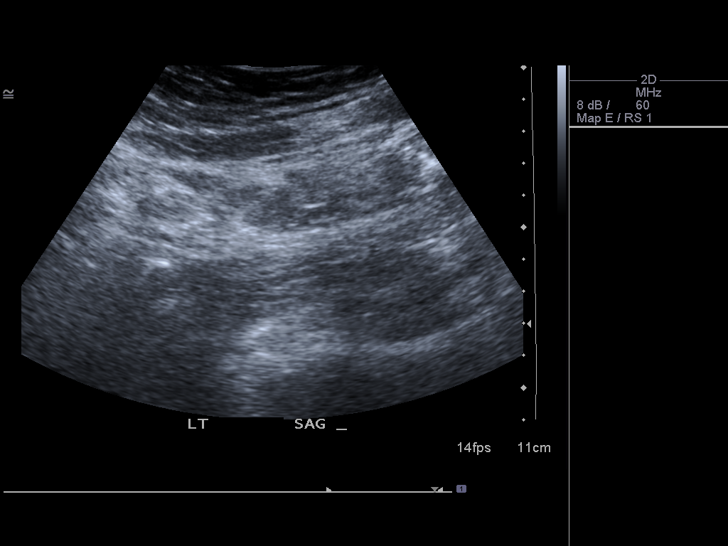
[im 31/91]
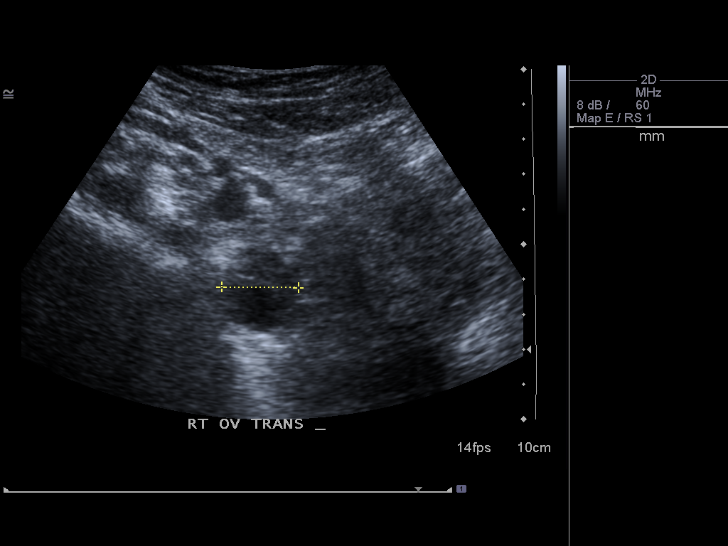
[im 38/91]
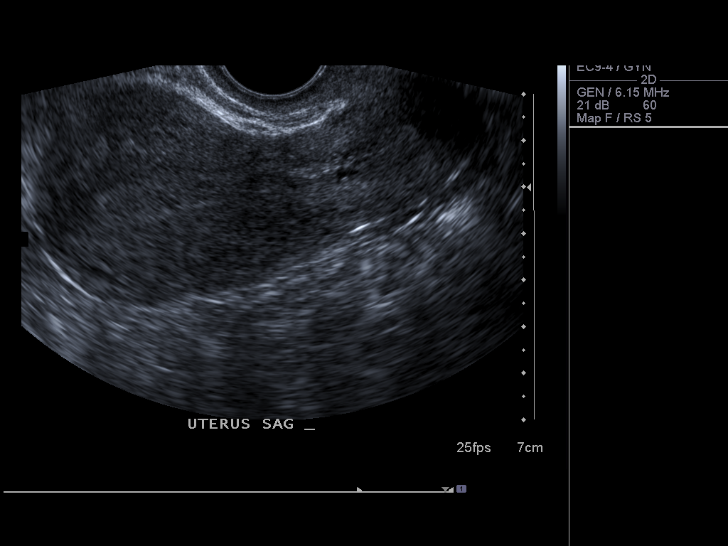
[im 46/91]
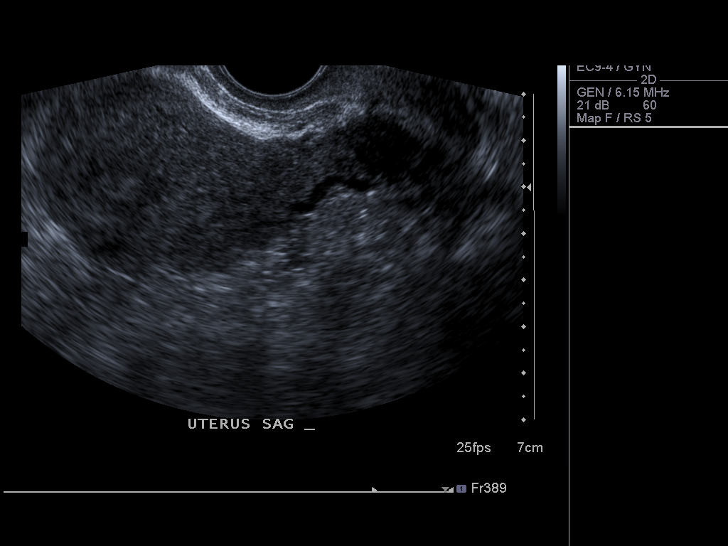
[im 53/91]
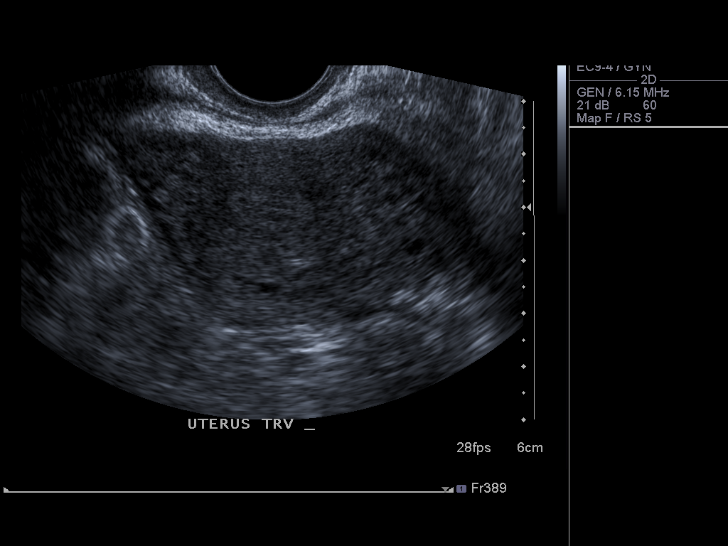
[im 61/91]
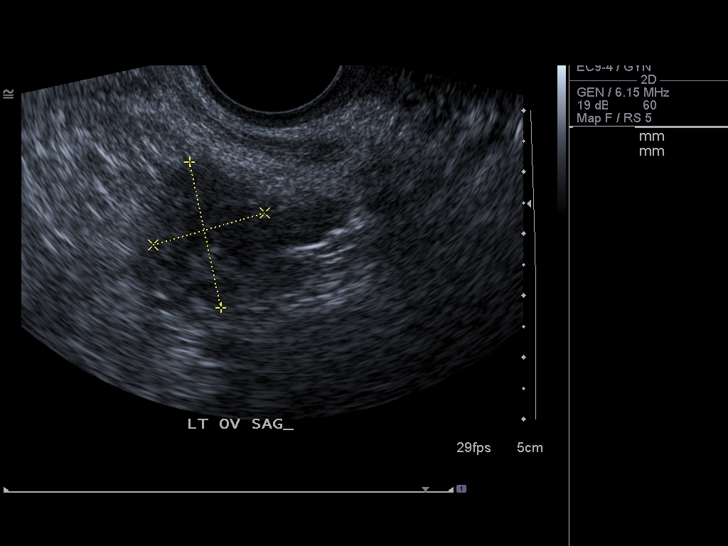
[im 68/91]
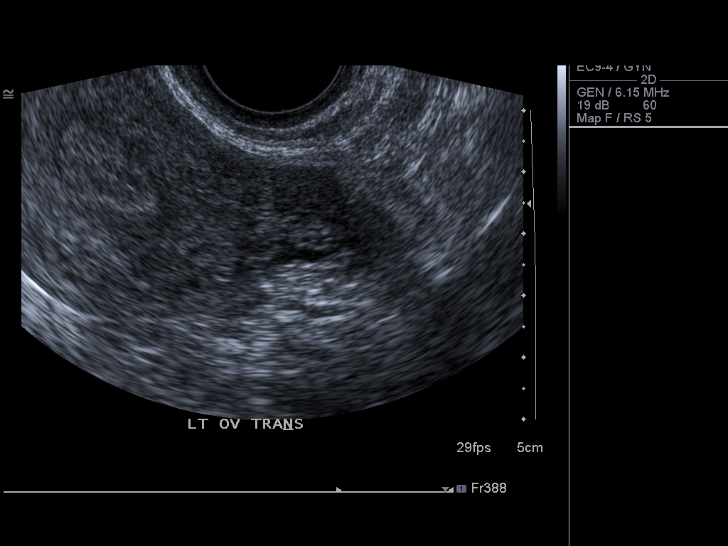
[im 76/91]
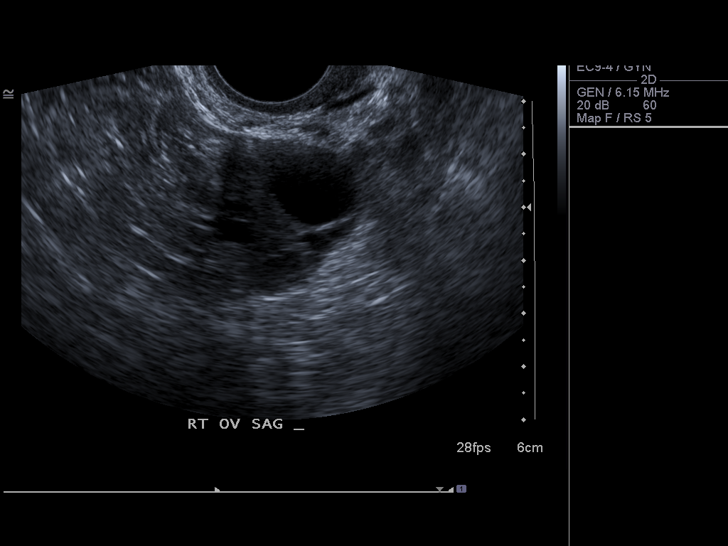
[im 83/91]
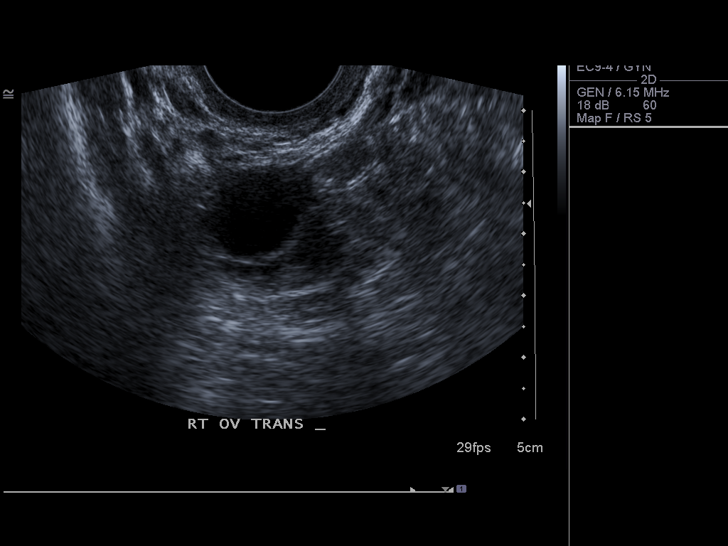
[im 91/91]
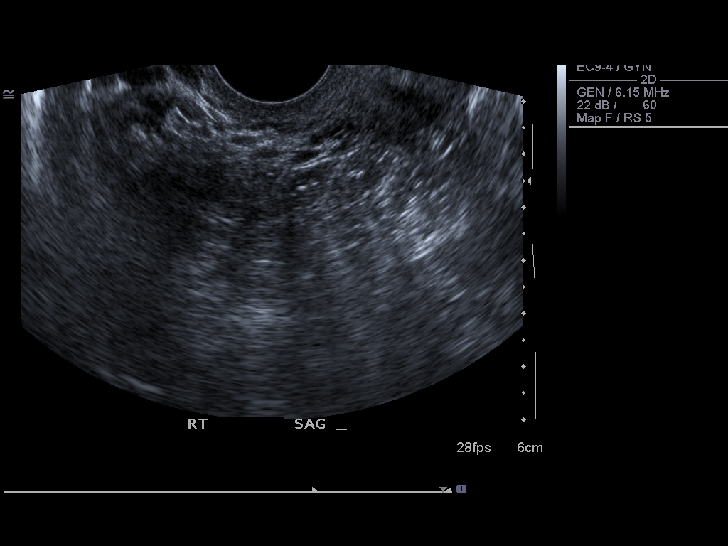

[13 of 25 positions shown; findings below may reference images not displayed]

FINDINGS: Uterus: Normal in size with heterogeneous myometrial echotexture,
measuring approximately 7.9 x 5.6 x 6.5 cm.  No focal myometrial
abnormality.

Endometrium: Normal and trilaminar in appearance measuring
approximately 11 mm in thickness.

Right ovary:  Normal in appearance containing small follicular
cysts, including an approximate 1.8 cm simple cyst.  Ovary measures
approximately 3.1 x 2.6 x 2.2 cm.  Normal color Doppler flow.

Left ovary: Normal in appearance containing small follicular cyst,
measuring approximately 4.1 x 2.1 x 2.3 cm.  Normal color Doppler
flow.

Other findings: No free pelvic fluid.
IMPRESSION: No significant abnormality.

## 2014-11-12 ENCOUNTER — Encounter (HOSPITAL_BASED_OUTPATIENT_CLINIC_OR_DEPARTMENT_OTHER): Payer: Self-pay | Admitting: Emergency Medicine

## 2014-11-12 ENCOUNTER — Emergency Department (HOSPITAL_BASED_OUTPATIENT_CLINIC_OR_DEPARTMENT_OTHER)
Admission: EM | Admit: 2014-11-12 | Discharge: 2014-11-13 | Disposition: A | Discharge status: Home / Self Care | Payer: Medicaid Other | Attending: Emergency Medicine | Admitting: Emergency Medicine

## 2014-11-12 DIAGNOSIS — Z8739 Personal history of other diseases of the musculoskeletal system and connective tissue: Secondary | ICD-10-CM | POA: Insufficient documentation

## 2014-11-12 DIAGNOSIS — Z9851 Tubal ligation status: Secondary | ICD-10-CM | POA: Insufficient documentation

## 2014-11-12 DIAGNOSIS — Z87442 Personal history of urinary calculi: Secondary | ICD-10-CM | POA: Diagnosis not present

## 2014-11-12 DIAGNOSIS — Z21 Asymptomatic human immunodeficiency virus [HIV] infection status: Secondary | ICD-10-CM | POA: Diagnosis not present

## 2014-11-12 DIAGNOSIS — Z862 Personal history of diseases of the blood and blood-forming organs and certain disorders involving the immune mechanism: Secondary | ICD-10-CM | POA: Diagnosis not present

## 2014-11-12 DIAGNOSIS — Z8659 Personal history of other mental and behavioral disorders: Secondary | ICD-10-CM | POA: Insufficient documentation

## 2014-11-12 DIAGNOSIS — Z8669 Personal history of other diseases of the nervous system and sense organs: Secondary | ICD-10-CM | POA: Insufficient documentation

## 2014-11-12 DIAGNOSIS — Z9089 Acquired absence of other organs: Secondary | ICD-10-CM | POA: Insufficient documentation

## 2014-11-12 DIAGNOSIS — Z79899 Other long term (current) drug therapy: Secondary | ICD-10-CM | POA: Insufficient documentation

## 2014-11-12 DIAGNOSIS — Z720 Tobacco use: Secondary | ICD-10-CM | POA: Insufficient documentation

## 2014-11-12 DIAGNOSIS — Z880 Allergy status to penicillin: Secondary | ICD-10-CM | POA: Insufficient documentation

## 2014-11-12 DIAGNOSIS — R109 Unspecified abdominal pain: Secondary | ICD-10-CM

## 2014-11-12 DIAGNOSIS — Z90710 Acquired absence of both cervix and uterus: Secondary | ICD-10-CM | POA: Insufficient documentation

## 2014-11-12 HISTORY — DX: Thrombocytopenia, unspecified: D69.6

## 2014-11-12 NOTE — ED Notes (Signed)
Patient states that she has rusty looking urine and lower back pain and flank left pain. The patient report nausea. The patient also reports that it woke her up about an hour ago.

## 2014-11-13 ENCOUNTER — Emergency Department (HOSPITAL_BASED_OUTPATIENT_CLINIC_OR_DEPARTMENT_OTHER): Payer: Medicaid Other

## 2014-11-13 ENCOUNTER — Encounter (HOSPITAL_BASED_OUTPATIENT_CLINIC_OR_DEPARTMENT_OTHER): Payer: Self-pay | Admitting: Emergency Medicine

## 2014-11-13 LAB — URINALYSIS, ROUTINE W REFLEX MICROSCOPIC
Bilirubin Urine: NEGATIVE
GLUCOSE, UA: NEGATIVE mg/dL
Ketones, ur: NEGATIVE mg/dL
Leukocytes, UA: NEGATIVE
Nitrite: NEGATIVE
PH: 5.5 (ref 5.0–8.0)
Protein, ur: 30 mg/dL — AB
SPECIFIC GRAVITY, URINE: 1.029 (ref 1.005–1.030)
Urobilinogen, UA: 0.2 mg/dL (ref 0.0–1.0)

## 2014-11-13 LAB — CBC WITH DIFFERENTIAL/PLATELET
Basophils Absolute: 0 10*3/uL (ref 0.0–0.1)
Basophils Relative: 0 % (ref 0–1)
EOS PCT: 0 % (ref 0–5)
Eosinophils Absolute: 0 10*3/uL (ref 0.0–0.7)
HEMATOCRIT: 40.7 % (ref 36.0–46.0)
Hemoglobin: 14.1 g/dL (ref 12.0–15.0)
LYMPHS ABS: 0.9 10*3/uL (ref 0.7–4.0)
LYMPHS PCT: 9 % — AB (ref 12–46)
MCH: 31.7 pg (ref 26.0–34.0)
MCHC: 34.6 g/dL (ref 30.0–36.0)
MCV: 91.5 fL (ref 78.0–100.0)
MONO ABS: 0.7 10*3/uL (ref 0.1–1.0)
Monocytes Relative: 7 % (ref 3–12)
Neutro Abs: 8 10*3/uL — ABNORMAL HIGH (ref 1.7–7.7)
Neutrophils Relative %: 84 % — ABNORMAL HIGH (ref 43–77)
Platelets: 204 10*3/uL (ref 150–400)
RBC: 4.45 MIL/uL (ref 3.87–5.11)
RDW: 12.4 % (ref 11.5–15.5)
WBC: 9.6 10*3/uL (ref 4.0–10.5)

## 2014-11-13 LAB — BASIC METABOLIC PANEL
Anion gap: 10 (ref 5–15)
BUN: 16 mg/dL (ref 6–23)
CO2: 27 mmol/L (ref 19–32)
Calcium: 9.4 mg/dL (ref 8.4–10.5)
Chloride: 100 mmol/L (ref 96–112)
Creatinine, Ser: 1.14 mg/dL — ABNORMAL HIGH (ref 0.50–1.10)
GFR, EST AFRICAN AMERICAN: 74 mL/min — AB (ref >90)
GFR, EST NON AFRICAN AMERICAN: 64 mL/min — AB (ref >90)
Glucose, Bld: 146 mg/dL — ABNORMAL HIGH (ref 70–99)
POTASSIUM: 3.7 mmol/L (ref 3.5–5.1)
SODIUM: 137 mmol/L (ref 135–145)

## 2014-11-13 LAB — URINE MICROSCOPIC-ADD ON

## 2014-11-13 MED ORDER — SODIUM CHLORIDE 0.9 % IV SOLN
INTRAVENOUS | Status: DC
  Administered 2014-11-13: 02:00:00 via INTRAVENOUS

## 2014-11-13 MED ORDER — HYDROMORPHONE HCL 4 MG PO TABS: 4.0000 mg | ORAL_TABLET | ORAL | Status: DC | PRN

## 2014-11-13 MED ORDER — HYDROMORPHONE HCL 1 MG/ML IJ SOLN
1.0000 mg | Freq: Once | INTRAMUSCULAR | Status: DC
Start: 2014-11-13 — End: 2014-11-13

## 2014-11-13 MED ORDER — ONDANSETRON 8 MG PO TBDP: 8.0000 mg | ORAL_TABLET | Freq: Three times a day (TID) | ORAL | Status: DC | PRN

## 2014-11-13 MED ORDER — ONDANSETRON HCL 4 MG/2ML IJ SOLN
4.0000 mg | Freq: Once | INTRAMUSCULAR | Status: AC
  Administered 2014-11-13: 4 mg via INTRAVENOUS
  Filled 2014-11-13: qty 2

## 2014-11-13 MED ORDER — HYDROMORPHONE HCL 1 MG/ML IJ SOLN
1.0000 mg | Freq: Once | INTRAMUSCULAR | Status: AC
  Administered 2014-11-13: 1 mg via INTRAVENOUS
  Filled 2014-11-13: qty 1

## 2014-11-13 NOTE — ED Provider Notes (Signed)
CSN: [id number]     Arrival date & time 11/12/14  2339 History   First MD Initiated Contact with Patient 11/13/14 0134     Chief Complaint  Patient presents with  . Flank Pain     (Consider location/radiation/quality/duration/timing/severity/associated sxs/prior Treatment) HPI  This is a 29 year old female with a history of HIV infection and kidney stones. She is here with a two-hour history of severe left flank pain. It is characterized as like previous kidney stones. She tried taking ibuprofen without relief. She has been nauseated but has not vomited. Pain is not significantly changed with movement. She states her urine has a rusty-looking appearance.  Past Medical History  Diagnosis Date  . HIV (human immunodeficiency virus infection)   . TMJ syndrome   . Simple seizure     r/t taking tramadol  . Ovarian cyst   . Kidney stone   . Anxiety   . Seizures   . Thrombocytopenia    Past Surgical History  Procedure Laterality Date  . Appendectomy    . Cesarean section    . Ankle surgery    . Tubal ligation    . Fracture surgery    . Lithotripsy    . Abdominal hysterectomy     History reviewed. No pertinent family history. History  Substance Use Topics  . Smoking status: Current Every Day Smoker -- 0.50 packs/day    Types: Cigarettes  . Smokeless tobacco: Never Used  . Alcohol Use: No   OB History    No data available     Review of Systems  All other systems reviewed and are negative.   Allergies  Penicillins; Naproxen; and Tramadol  Home Medications   Prior to Admission medications   Medication Sig Start Date End Date Taking? Authorizing Provider  Aspirin-Acetaminophen-Caffeine (GOODY HEADACHE PO) Take 1 packet by mouth daily as needed. For pain    Historical Provider, MD  benzonatate (TESSALON) 100 MG capsule Take 1 capsule (100 mg total) by mouth every 8 (eight) hours. 05/26/13   Antony Madura, PA-C  Darunavir Ethanolate (PREZISTA PO) Take 1 tablet by mouth daily.     Historical Provider, MD  emtricitabine-tenofovir (TRUVADA) 200-300 MG per tablet Take 1 tablet by mouth 2 (two) times daily. HIV medication.  Consult needed.    Historical Provider, MD  ritonavir (NORVIR) 100 MG capsule Take 100 mg by mouth 2 (two) times daily. HIV medication.  Consult needed.    Historical Provider, MD   BP 116/67 mmHg  Pulse 96  Temp(Src) 98.6 F (37 C) (Oral)  Resp 18  Ht  (1.651 m)  Wt 160 lb (72.576 kg)  BMI 26.63 kg/m2  SpO2 97%  LMP 06/25/2012   Physical Exam  General: Well-developed, well-nourished female in no acute distress; appearance consistent with age of record HENT: normocephalic; atraumatic Eyes: pupils equal, round and reactive to light; extraocular muscles intact Neck: supple Heart: regular rate and rhythm Lungs: clear to auscultation bilaterally Abdomen: soft; nondistended; nontender; no masses or hepatosplenomegaly; bowel sounds present GU: Mild left CVA tenderness Extremities: No deformity; full range of motion; pulses normal Neurologic: Awake, alert and oriented; motor function intact in all extremities and symmetric; no facial droop Skin: Warm and dry Psychiatric: tearful; flat affect   ED Course  Procedures (including critical care time)   MDM   Nursing notes and vitals signs, including pulse oximetry, reviewed.  Summary of this visit's results, reviewed by myself:  Labs:  Results for orders placed or performed during the  hospital encounter of 11/12/14 (from the past 24 hour(s))  CBC with Differential     Status: Abnormal   Collection Time: 11/13/14 12:05 AM  Result Value Ref Range   WBC 9.6 4.0 - 10.5 K/uL   RBC 4.45 3.87 - 5.11 MIL/uL   Hemoglobin 14.1 12.0 - 15.0 g/dL   HCT 24.4 01.0 - 27.2 %   MCV 91.5 78.0 - 100.0 fL   MCH 31.7 26.0 - 34.0 pg   MCHC 34.6 30.0 - 36.0 g/dL   RDW 53.6 64.4 - 03.4 %   Platelets 204 150 - 400 K/uL   Neutrophils Relative % 84 (H) 43 - 77 %   Neutro Abs 8.0 (H) 1.7 - 7.7 K/uL    Lymphocytes Relative 9 (L) 12 - 46 %   Lymphs Abs 0.9 0.7 - 4.0 K/uL   Monocytes Relative 7 3 - 12 %   Monocytes Absolute 0.7 0.1 - 1.0 K/uL   Eosinophils Relative 0 0 - 5 %   Eosinophils Absolute 0.0 0.0 - 0.7 K/uL   Basophils Relative 0 0 - 1 %   Basophils Absolute 0.0 0.0 - 0.1 K/uL  Basic metabolic panel     Status: Abnormal   Collection Time: 11/13/14 12:05 AM  Result Value Ref Range   Sodium 137 135 - 145 mmol/L   Potassium 3.7 3.5 - 5.1 mmol/L   Chloride 100 96 - 112 mmol/L   CO2 27 19 - 32 mmol/L   Glucose, Bld 146 (H) 70 - 99 mg/dL   BUN 16 6 - 23 mg/dL   Creatinine, Ser 7.42 (H) 0.50 - 1.10 mg/dL   Calcium 9.4 8.4 - 59.5 mg/dL   GFR calc non Af Amer 64 (L) >90 mL/min   GFR calc Af Amer 74 (L) >90 mL/min   Anion gap 10 5 - 15  Urinalysis, Routine w reflex microscopic     Status: Abnormal   Collection Time: 11/13/14 12:08 AM  Result Value Ref Range   Color, Urine YELLOW YELLOW   APPearance CLEAR CLEAR   Specific Gravity, Urine 1.029 1.005 - 1.030   pH 5.5 5.0 - 8.0   Glucose, UA NEGATIVE NEGATIVE mg/dL   Hgb urine dipstick LARGE (A) NEGATIVE   Bilirubin Urine NEGATIVE NEGATIVE   Ketones, ur NEGATIVE NEGATIVE mg/dL   Protein, ur 30 (A) NEGATIVE mg/dL   Urobilinogen, UA 0.2 0.0 - 1.0 mg/dL   Nitrite NEGATIVE NEGATIVE   Leukocytes, UA NEGATIVE NEGATIVE  Urine microscopic-add on     Status: Abnormal   Collection Time: 11/13/14 12:08 AM  Result Value Ref Range   Squamous Epithelial / LPF RARE RARE   WBC, UA 0-2 <3 WBC/hpf   RBC / HPF TOO NUMEROUS TO COUNT <3 RBC/hpf   Bacteria, UA FEW (A) RARE    Imaging Studies: Ct Renal Stone Study  11/13/2014   CLINICAL DATA:  Initial evaluation for acute left flank pain, hematuria.  EXAM: CT ABDOMEN AND PELVIS WITHOUT CONTRAST  TECHNIQUE: Multidetector CT imaging of the abdomen and pelvis was performed following the standard protocol without IV contrast.  COMPARISON:  Prior study from 05/05/2013.  FINDINGS: The visualized lung  bases are clear. No pleural or pericardial effusion.  Limited noncontrast evaluation of the liver is unremarkable. Gallbladder within normal limits. No biliary dilatation. Spleen, adrenal glands, and pancreas are within normal limits.  Kidneys are equal in size. There are punctate nonobstructive calculi bilaterally measuring up to 3 mm. No CT evidence for obstructive uropathy. No  radiopaque calculi seen along the course of either renal collecting system. There is no hydroureter.  Stomach within normal limits. Small bowel of normal caliber without evidence of obstruction or associated inflammatory changes. Suture material at the cecum cyst suggestive of prior appendectomy. No acute inflammatory changes seen about the bowels.  Bladder within normal limits.  Uterus is absent. Right ovary is unremarkable. Left ovary is not discretely identified.  No free air or fluid.  No adenopathy.  No acute osseous abnormality. No worrisome lytic or blastic osseous lesions.  IMPRESSION: 1. Punctate nonobstructive nephrolithiasis measuring up to 3 mm within the kidneys bilaterally. No CT evidence for obstructive uropathy. 2. No other acute intra-abdominal or pelvic process. 3. Sequelae of prior appendectomy and hysterectomy.   Electronically Signed   By: Rise MuBenjamin  McClintock M.D.   On: 11/13/2014 02:43   Patient is on a protease inhibitor. Protease inhibitors are known to cause stones that do not appear on CT scans. We will treat presumptively for kidney stone.      Hanley SeamenJohn L Tecumseh Yeagley, MD 11/13/14 [phone]

## 2014-11-13 NOTE — ED Notes (Signed)
Patient transported to CT 

## 2014-12-22 ENCOUNTER — Emergency Department (HOSPITAL_BASED_OUTPATIENT_CLINIC_OR_DEPARTMENT_OTHER)
Admission: EM | Admit: 2014-12-22 | Discharge: 2014-12-22 | Disposition: A | Discharge status: Home / Self Care | Payer: Medicaid Other | Attending: Emergency Medicine | Admitting: Emergency Medicine

## 2014-12-22 ENCOUNTER — Encounter (HOSPITAL_BASED_OUTPATIENT_CLINIC_OR_DEPARTMENT_OTHER): Payer: Self-pay

## 2014-12-22 DIAGNOSIS — Z8619 Personal history of other infectious and parasitic diseases: Secondary | ICD-10-CM | POA: Diagnosis not present

## 2014-12-22 DIAGNOSIS — X58XXXA Exposure to other specified factors, initial encounter: Secondary | ICD-10-CM | POA: Insufficient documentation

## 2014-12-22 DIAGNOSIS — S161XXA Strain of muscle, fascia and tendon at neck level, initial encounter: Secondary | ICD-10-CM | POA: Diagnosis not present

## 2014-12-22 DIAGNOSIS — Y9289 Other specified places as the place of occurrence of the external cause: Secondary | ICD-10-CM | POA: Insufficient documentation

## 2014-12-22 DIAGNOSIS — Z8739 Personal history of other diseases of the musculoskeletal system and connective tissue: Secondary | ICD-10-CM | POA: Diagnosis not present

## 2014-12-22 DIAGNOSIS — Y998 Other external cause status: Secondary | ICD-10-CM | POA: Diagnosis not present

## 2014-12-22 DIAGNOSIS — Z8659 Personal history of other mental and behavioral disorders: Secondary | ICD-10-CM | POA: Insufficient documentation

## 2014-12-22 DIAGNOSIS — Z21 Asymptomatic human immunodeficiency virus [HIV] infection status: Secondary | ICD-10-CM | POA: Diagnosis not present

## 2014-12-22 DIAGNOSIS — Z862 Personal history of diseases of the blood and blood-forming organs and certain disorders involving the immune mechanism: Secondary | ICD-10-CM | POA: Diagnosis not present

## 2014-12-22 DIAGNOSIS — Y9301 Activity, walking, marching and hiking: Secondary | ICD-10-CM | POA: Insufficient documentation

## 2014-12-22 DIAGNOSIS — M542 Cervicalgia: Secondary | ICD-10-CM | POA: Diagnosis present

## 2014-12-22 DIAGNOSIS — Z8669 Personal history of other diseases of the nervous system and sense organs: Secondary | ICD-10-CM | POA: Insufficient documentation

## 2014-12-22 DIAGNOSIS — T148XXA Other injury of unspecified body region, initial encounter: Secondary | ICD-10-CM

## 2014-12-22 DIAGNOSIS — Z8742 Personal history of other diseases of the female genital tract: Secondary | ICD-10-CM | POA: Insufficient documentation

## 2014-12-22 DIAGNOSIS — Z87442 Personal history of urinary calculi: Secondary | ICD-10-CM | POA: Insufficient documentation

## 2014-12-22 HISTORY — DX: Unspecified viral hepatitis C without hepatic coma: B19.20

## 2014-12-22 MED ORDER — HYDROCODONE-ACETAMINOPHEN 5-325 MG PO TABS: 2.0000 | ORAL_TABLET | ORAL | Status: DC | PRN

## 2014-12-22 NOTE — ED Notes (Signed)
MD at bedside. 

## 2014-12-22 NOTE — ED Notes (Addendum)
Pt reports she was walking her dog on leash on Saturday. Reports dog pulled away from her, "Pulling my neck and right shoulder." pt  sts "I already have shoulder problems."

## 2014-12-22 NOTE — Discharge Instructions (Signed)

## 2014-12-22 NOTE — ED Notes (Signed)
Pt put gown on with no difficulty.

## 2014-12-22 NOTE — ED Provider Notes (Signed)
CSN: [id number]     Arrival date & time 12/22/14  0840 History   First MD Initiated Contact with Patient 12/22/14 [phone]     Chief Complaint  Patient presents with  . Neck Pain  . Shoulder Pain     (Consider location/radiation/quality/duration/timing/severity/associated sxs/prior Treatment) HPI Comments: 29 year old female who states that 2 days ago while she was walking her dog the dog ran after something and caused her right arm pain when it pulled quickly, she also has pain in the upper part of her neck, she has no numbness or weakness of the arm. Symptoms are persistent, worse with movement of the right arm especially causing pain in the right shoulder and right upper chest. Motrin and Flexeril without relief prior to arrival  Patient is a 29 y.o. female presenting with neck pain and shoulder pain. The history is provided by the patient.  Neck Pain Associated symptoms: no fever, no numbness and no weakness   Shoulder Pain Associated symptoms: neck pain   Associated symptoms: no back pain and no fever     Past Medical History  Diagnosis Date  . HIV (human immunodeficiency virus infection)   . TMJ syndrome   . Simple seizure     r/t taking tramadol  . Ovarian cyst   . Kidney stone   . Anxiety   . Seizures   . Thrombocytopenia   . Hepatitis C    Past Surgical History  Procedure Laterality Date  . Appendectomy    . Cesarean section    . Ankle surgery    . Tubal ligation    . Fracture surgery    . Lithotripsy    . Abdominal hysterectomy     No family history on file. History  Substance Use Topics  . Smoking status: Current Every Day Smoker -- 0.50 packs/day    Types: Cigarettes  . Smokeless tobacco: Never Used  . Alcohol Use: No   OB History    No data available     Review of Systems  Constitutional: Negative for fever and chills.  Cardiovascular: Negative for leg swelling.  Gastrointestinal: Negative for nausea and vomiting.       No incontinence of bowel   Genitourinary: Negative for difficulty urinating.       No incontinence or retention  Musculoskeletal: Positive for arthralgias and neck pain. Negative for back pain.  Skin: Negative for rash.  Neurological: Negative for weakness and numbness.      Allergies  Penicillins; Naproxen; and Tramadol  Home Medications   Prior to Admission medications   Medication Sig Start Date End Date Taking? Authorizing Provider  Aspirin-Acetaminophen-Caffeine (GOODY HEADACHE PO) Take 1 packet by mouth daily as needed. For pain    Historical Provider, MD  benzonatate (TESSALON) 100 MG capsule Take 1 capsule (100 mg total) by mouth every 8 (eight) hours. 05/26/13   Antony Madura, PA-C  Darunavir Ethanolate (PREZISTA PO) Take 1 tablet by mouth daily.    Historical Provider, MD  emtricitabine-tenofovir (TRUVADA) 200-300 MG per tablet Take 1 tablet by mouth 2 (two) times daily. HIV medication.  Consult needed.    Historical Provider, MD  HYDROcodone-acetaminophen (NORCO/VICODIN) 5-325 MG per tablet Take 2 tablets by mouth every 4 (four) hours as needed. 12/22/14   Eber Hong, MD  HYDROmorphone (DILAUDID) 4 MG tablet Take 1 tablet (4 mg total) by mouth every 4 (four) hours as needed for severe pain (for pain). 11/13/14   John Molpus, MD  ondansetron (ZOFRAN ODT) 8 MG disintegrating  tablet Take 1 tablet (8 mg total) by mouth every 8 (eight) hours as needed for nausea or vomiting. 11/13/14   Paula LibraJohn Molpus, MD  ritonavir (NORVIR) 100 MG capsule Take 100 mg by mouth 2 (two) times daily. HIV medication.  Consult needed.    Historical Provider, MD   BP 119/75 mmHg  Pulse 79  Temp(Src) 98.7 F (37.1 C) (Oral)  Resp 16  Ht 5\' 5"  (1.651 m)  Wt 160 lb (72.576 kg)  BMI 26.63 kg/m2  SpO2 100%  LMP 06/25/2012 Physical Exam  Constitutional: She appears well-developed and well-nourished. No distress.  HENT:  Head: Normocephalic and atraumatic.  Eyes: Conjunctivae are normal. Right eye exhibits no discharge. Left eye  exhibits no discharge. No scleral icterus.  Cardiovascular: Normal rate and regular rhythm.   Pulmonary/Chest: Effort normal and breath sounds normal.  Musculoskeletal: She exhibits tenderness (tenderness over the back of the neck in the cervical region both paraspinal and spinal areas, tenderness over the right upper chest wall and right shoulder). She exhibits no edema.  Normal range of motion of all joints, some pain with range of motion of the right shoulder  Neurological: She is alert. Coordination normal.  Speech is clear, strength in the UE and LE's are normal   Skin: Skin is warm and dry. No rash noted. She is not diaphoretic.    ED Course  Procedures (including critical care time) Labs Review Labs Reviewed - No data to display  Imaging Review No results found.    MDM   Final diagnoses:  Muscle strain    No deformities to suggest fracture or dislocations, likely has muscle strains, will give short course of Vicodin, no other pain control as the patient needs outpatient follow-up for ongoing pain  Eber HongBrian Mckala Pantaleon, MD 12/22/14 [phone]

## 2015-02-02 ENCOUNTER — Encounter (HOSPITAL_BASED_OUTPATIENT_CLINIC_OR_DEPARTMENT_OTHER): Payer: Self-pay | Admitting: *Deleted

## 2015-02-02 ENCOUNTER — Emergency Department (HOSPITAL_BASED_OUTPATIENT_CLINIC_OR_DEPARTMENT_OTHER)
Admission: EM | Admit: 2015-02-02 | Discharge: 2015-02-02 | Disposition: A | Discharge status: Home / Self Care | Payer: Medicaid Other | Attending: Emergency Medicine | Admitting: Emergency Medicine

## 2015-02-02 DIAGNOSIS — Z21 Asymptomatic human immunodeficiency virus [HIV] infection status: Secondary | ICD-10-CM | POA: Insufficient documentation

## 2015-02-02 DIAGNOSIS — Z720 Tobacco use: Secondary | ICD-10-CM | POA: Diagnosis not present

## 2015-02-02 DIAGNOSIS — R109 Unspecified abdominal pain: Secondary | ICD-10-CM | POA: Diagnosis present

## 2015-02-02 DIAGNOSIS — Z862 Personal history of diseases of the blood and blood-forming organs and certain disorders involving the immune mechanism: Secondary | ICD-10-CM | POA: Insufficient documentation

## 2015-02-02 DIAGNOSIS — Z880 Allergy status to penicillin: Secondary | ICD-10-CM | POA: Insufficient documentation

## 2015-02-02 DIAGNOSIS — R1032 Left lower quadrant pain: Secondary | ICD-10-CM | POA: Diagnosis not present

## 2015-02-02 DIAGNOSIS — Z8669 Personal history of other diseases of the nervous system and sense organs: Secondary | ICD-10-CM | POA: Insufficient documentation

## 2015-02-02 DIAGNOSIS — M545 Low back pain, unspecified: Secondary | ICD-10-CM

## 2015-02-02 DIAGNOSIS — Z79899 Other long term (current) drug therapy: Secondary | ICD-10-CM | POA: Insufficient documentation

## 2015-02-02 DIAGNOSIS — Z8742 Personal history of other diseases of the female genital tract: Secondary | ICD-10-CM | POA: Insufficient documentation

## 2015-02-02 DIAGNOSIS — Z8659 Personal history of other mental and behavioral disorders: Secondary | ICD-10-CM | POA: Insufficient documentation

## 2015-02-02 DIAGNOSIS — Z8619 Personal history of other infectious and parasitic diseases: Secondary | ICD-10-CM | POA: Diagnosis not present

## 2015-02-02 DIAGNOSIS — Z87442 Personal history of urinary calculi: Secondary | ICD-10-CM | POA: Diagnosis not present

## 2015-02-02 LAB — CBC
HCT: 41.6 % (ref 36.0–46.0)
HEMOGLOBIN: 14.2 g/dL (ref 12.0–15.0)
MCH: 30.7 pg (ref 26.0–34.0)
MCHC: 34.1 g/dL (ref 30.0–36.0)
MCV: 89.8 fL (ref 78.0–100.0)
PLATELETS: 188 10*3/uL (ref 150–400)
RBC: 4.63 MIL/uL (ref 3.87–5.11)
RDW: 11.9 % (ref 11.5–15.5)
WBC: 5.1 10*3/uL (ref 4.0–10.5)

## 2015-02-02 LAB — URINALYSIS, ROUTINE W REFLEX MICROSCOPIC
Bilirubin Urine: NEGATIVE
Glucose, UA: NEGATIVE mg/dL
Ketones, ur: NEGATIVE mg/dL
LEUKOCYTES UA: NEGATIVE
Nitrite: NEGATIVE
Protein, ur: NEGATIVE mg/dL
SPECIFIC GRAVITY, URINE: 1.012 (ref 1.005–1.030)
UROBILINOGEN UA: 0.2 mg/dL (ref 0.0–1.0)
pH: 5.5 (ref 5.0–8.0)

## 2015-02-02 LAB — COMPREHENSIVE METABOLIC PANEL
ALK PHOS: 81 U/L (ref 38–126)
ALT: 51 U/L (ref 14–54)
AST: 36 U/L (ref 15–41)
Albumin: 4.4 g/dL (ref 3.5–5.0)
Anion gap: 9 (ref 5–15)
BUN: 8 mg/dL (ref 6–20)
CO2: 24 mmol/L (ref 22–32)
Calcium: 9.3 mg/dL (ref 8.9–10.3)
Chloride: 107 mmol/L (ref 101–111)
Creatinine, Ser: 1.04 mg/dL — ABNORMAL HIGH (ref 0.44–1.00)
GFR calc Af Amer: >60 mL/min (ref >60)
GLUCOSE: 97 mg/dL (ref 65–99)
POTASSIUM: 4 mmol/L (ref 3.5–5.1)
SODIUM: 140 mmol/L (ref 135–145)
TOTAL PROTEIN: 7.2 g/dL (ref 6.5–8.1)
Total Bilirubin: 0.6 mg/dL (ref 0.3–1.2)

## 2015-02-02 LAB — URINE MICROSCOPIC-ADD ON

## 2015-02-02 MED ORDER — HYDROCODONE-ACETAMINOPHEN 5-325 MG PO TABS: 1.0000 | ORAL_TABLET | ORAL | Status: DC | PRN

## 2015-02-02 NOTE — ED Notes (Signed)
There are no nonverbal signs of pain.  Pt rates pain 9/10.  Watching TV and laughing with family member.

## 2015-02-02 NOTE — ED Notes (Signed)
Pt c/o left flank pain x 4 hrs

## 2015-02-02 NOTE — Discharge Instructions (Signed)

## 2015-02-02 NOTE — ED Provider Notes (Signed)
CSN: [id number]     Arrival date & time 02/02/15  1714 History   This chart was scribed for Arby BarretteMarcy Keesha Pellum, MD by Abel PrestoKara Demonbreun, ED Scribe. This patient was seen in room MH12/MH12 and the patient's care was started at 7:49 PM.    Chief Complaint  Patient presents with  . Flank Pain     The history is provided by the patient. No language interpreter was used.    HPI Comments: Dawn Velasquez is a 29 y.o. female with PMHx of HIV, ovarian cyst, and renal calculi who presents to the Emergency Department complaining of constant sharp stabbing left flank pain with onset this morning. Pt states pain radiates to left side of abdomen. Pt with h/o tubal ligation and hysterectomy. Pt reports recurrent hematuria but states she has been told it may be related to her HIV medication. Pt denies fever and chills.   Past Medical History  Diagnosis Date  . HIV (human immunodeficiency virus infection)   . TMJ syndrome   . Simple seizure     r/t taking tramadol  . Ovarian cyst   . Kidney stone   . Anxiety   . Seizures   . Thrombocytopenia   . Hepatitis C    Past Surgical History  Procedure Laterality Date  . Appendectomy    . Cesarean section    . Ankle surgery    . Tubal ligation    . Fracture surgery    . Lithotripsy    . Abdominal hysterectomy     History reviewed. No pertinent family history. History  Substance Use Topics  . Smoking status: Current Every Day Smoker -- 0.50 packs/day    Types: Cigarettes  . Smokeless tobacco: Never Used  . Alcohol Use: No   OB History    No data available     Review of Systems  Genitourinary: Positive for flank pain.  10 Systems reviewed and all are negative for acute change except as noted in the HPI.      Allergies  Penicillins; Naproxen; and Tramadol  Home Medications   Prior to Admission medications   Medication Sig Start Date End Date Taking? Authorizing Provider  Aspirin-Acetaminophen-Caffeine (GOODY HEADACHE PO) Take 1  packet by mouth daily as needed. For pain    Historical Provider, MD  benzonatate (TESSALON) 100 MG capsule Take 1 capsule (100 mg total) by mouth every 8 (eight) hours. 05/26/13   Antony MaduraKelly Humes, PA-C  Darunavir Ethanolate (PREZISTA PO) Take 1 tablet by mouth daily.    Historical Provider, MD  emtricitabine-tenofovir (TRUVADA) 200-300 MG per tablet Take 1 tablet by mouth 2 (two) times daily. HIV medication.  Consult needed.    Historical Provider, MD  HYDROcodone-acetaminophen (NORCO/VICODIN) 5-325 MG per tablet Take 2 tablets by mouth every 4 (four) hours as needed. 12/22/14   Eber HongBrian Miller, MD  HYDROcodone-acetaminophen (NORCO/VICODIN) 5-325 MG per tablet Take 1-2 tablets by mouth every 4 (four) hours as needed for moderate pain or severe pain. 02/02/15   Arby BarretteMarcy Coley Littles, MD  HYDROmorphone (DILAUDID) 4 MG tablet Take 1 tablet (4 mg total) by mouth every 4 (four) hours as needed for severe pain (for pain). 11/13/14   John Molpus, MD  ondansetron (ZOFRAN ODT) 8 MG disintegrating tablet Take 1 tablet (8 mg total) by mouth every 8 (eight) hours as needed for nausea or vomiting. 11/13/14   Paula LibraJohn Molpus, MD  ritonavir (NORVIR) 100 MG capsule Take 100 mg by mouth 2 (two) times daily. HIV medication.  Consult needed.  Historical Provider, MD   BP 113/73 mmHg  Pulse 68  Temp(Src) 97.8 F (36.6 C) (Oral)  Resp 18  Ht 5\' 5"  (1.651 m)  Wt 170 lb (77.111 kg)  BMI 28.29 kg/m2  SpO2 97%  LMP 06/25/2012 Physical Exam  Constitutional: She is oriented to person, place, and time. She appears well-developed and well-nourished.  HENT:  Head: Normocephalic and atraumatic.  Eyes: EOM are normal. Pupils are equal, round, and reactive to light.  Neck: Neck supple.  Cardiovascular: Normal rate, regular rhythm, normal heart sounds and intact distal pulses.   Pulmonary/Chest: Effort normal and breath sounds normal.  Abdominal: Soft. Bowel sounds are normal. She exhibits no distension. There is no tenderness.   Musculoskeletal: Normal range of motion. She exhibits no edema.  Neurological: She is alert and oriented to person, place, and time. She has normal strength. Coordination normal. GCS eye subscore is 4. GCS verbal subscore is 5. GCS motor subscore is 6.  Skin: Skin is warm, dry and intact.  Psychiatric: She has a normal mood and affect.    ED Course  Procedures (including critical care time) DIAGNOSTIC STUDIES:  COORDINATION OF CARE: 7:54 PM Discussed treatment plan with patient at beside, the patient agrees with the plan and has no further questions at this time.   Labs Review Labs Reviewed  URINALYSIS, ROUTINE W REFLEX MICROSCOPIC - Abnormal; Notable for the following:    APPearance CLOUDY (*)    Hgb urine dipstick LARGE (*)    All other components within normal limits  URINE MICROSCOPIC-ADD ON - Abnormal; Notable for the following:    Squamous Epithelial / LPF FEW (*)    All other components within normal limits  COMPREHENSIVE METABOLIC PANEL - Abnormal; Notable for the following:    Creatinine, Ser 1.04 (*)    All other components within normal limits  CBC    Imaging Review No results found.   EKG Interpretation None      MDM   Final diagnoses:  Left-sided low back pain without sciatica  Left lower quadrant pain   At this time pain appears to be nonsurgical. Urinalysis does not indicate acute UTI. The patient is well in appearance. She'll be advised for continued outpatient management.    Arby BarretteMarcy Khrystina Bonnes, MD 02/08/15 [phone]

## 2015-03-17 ENCOUNTER — Emergency Department (HOSPITAL_BASED_OUTPATIENT_CLINIC_OR_DEPARTMENT_OTHER)
Admission: EM | Admit: 2015-03-17 | Discharge: 2015-03-17 | Disposition: A | Discharge status: Home / Self Care | Payer: Medicaid Other | Attending: Emergency Medicine | Admitting: Emergency Medicine

## 2015-03-17 ENCOUNTER — Emergency Department (HOSPITAL_BASED_OUTPATIENT_CLINIC_OR_DEPARTMENT_OTHER): Payer: Medicaid Other

## 2015-03-17 DIAGNOSIS — Z720 Tobacco use: Secondary | ICD-10-CM | POA: Insufficient documentation

## 2015-03-17 DIAGNOSIS — Z862 Personal history of diseases of the blood and blood-forming organs and certain disorders involving the immune mechanism: Secondary | ICD-10-CM | POA: Diagnosis not present

## 2015-03-17 DIAGNOSIS — Z8669 Personal history of other diseases of the nervous system and sense organs: Secondary | ICD-10-CM | POA: Insufficient documentation

## 2015-03-17 DIAGNOSIS — Z8619 Personal history of other infectious and parasitic diseases: Secondary | ICD-10-CM | POA: Diagnosis not present

## 2015-03-17 DIAGNOSIS — R109 Unspecified abdominal pain: Secondary | ICD-10-CM

## 2015-03-17 DIAGNOSIS — Z8659 Personal history of other mental and behavioral disorders: Secondary | ICD-10-CM | POA: Diagnosis not present

## 2015-03-17 DIAGNOSIS — Z880 Allergy status to penicillin: Secondary | ICD-10-CM | POA: Insufficient documentation

## 2015-03-17 DIAGNOSIS — R319 Hematuria, unspecified: Secondary | ICD-10-CM | POA: Insufficient documentation

## 2015-03-17 DIAGNOSIS — Z21 Asymptomatic human immunodeficiency virus [HIV] infection status: Secondary | ICD-10-CM | POA: Insufficient documentation

## 2015-03-17 DIAGNOSIS — Z9889 Other specified postprocedural states: Secondary | ICD-10-CM | POA: Diagnosis not present

## 2015-03-17 DIAGNOSIS — Z79899 Other long term (current) drug therapy: Secondary | ICD-10-CM | POA: Diagnosis not present

## 2015-03-17 DIAGNOSIS — Z8742 Personal history of other diseases of the female genital tract: Secondary | ICD-10-CM | POA: Diagnosis not present

## 2015-03-17 DIAGNOSIS — Z90710 Acquired absence of both cervix and uterus: Secondary | ICD-10-CM | POA: Insufficient documentation

## 2015-03-17 DIAGNOSIS — Z8739 Personal history of other diseases of the musculoskeletal system and connective tissue: Secondary | ICD-10-CM | POA: Insufficient documentation

## 2015-03-17 DIAGNOSIS — Z87442 Personal history of urinary calculi: Secondary | ICD-10-CM | POA: Insufficient documentation

## 2015-03-17 DIAGNOSIS — Z9049 Acquired absence of other specified parts of digestive tract: Secondary | ICD-10-CM | POA: Diagnosis not present

## 2015-03-17 DIAGNOSIS — Z9851 Tubal ligation status: Secondary | ICD-10-CM | POA: Insufficient documentation

## 2015-03-17 LAB — URINALYSIS, ROUTINE W REFLEX MICROSCOPIC
Bilirubin Urine: NEGATIVE
GLUCOSE, UA: NEGATIVE mg/dL
KETONES UR: NEGATIVE mg/dL
LEUKOCYTES UA: NEGATIVE
Nitrite: NEGATIVE
PH: 6 (ref 5.0–8.0)
Protein, ur: NEGATIVE mg/dL
Specific Gravity, Urine: 1.011 (ref 1.005–1.030)
Urobilinogen, UA: 0.2 mg/dL (ref 0.0–1.0)

## 2015-03-17 LAB — BASIC METABOLIC PANEL
Anion gap: 10 (ref 5–15)
BUN: 12 mg/dL (ref 6–20)
CALCIUM: 9.4 mg/dL (ref 8.9–10.3)
CO2: 21 mmol/L — ABNORMAL LOW (ref 22–32)
Chloride: 108 mmol/L (ref 101–111)
Creatinine, Ser: 1 mg/dL (ref 0.44–1.00)
GFR calc Af Amer: >60 mL/min (ref >60)
GFR calc non Af Amer: >60 mL/min (ref >60)
Glucose, Bld: 110 mg/dL — ABNORMAL HIGH (ref 65–99)
Potassium: 4 mmol/L (ref 3.5–5.1)
Sodium: 139 mmol/L (ref 135–145)

## 2015-03-17 LAB — URINE MICROSCOPIC-ADD ON

## 2015-03-17 MED ORDER — SODIUM CHLORIDE 0.9 % IV BOLUS (SEPSIS)
500.0000 mL | Freq: Once | INTRAVENOUS | Status: AC
  Administered 2015-03-17: 500 mL via INTRAVENOUS

## 2015-03-17 MED ORDER — KETOROLAC TROMETHAMINE 30 MG/ML IJ SOLN
30.0000 mg | Freq: Once | INTRAMUSCULAR | Status: AC
  Administered 2015-03-17: 30 mg via INTRAVENOUS
  Filled 2015-03-17: qty 1

## 2015-03-17 MED ORDER — ONDANSETRON HCL 4 MG/2ML IJ SOLN
4.0000 mg | Freq: Once | INTRAMUSCULAR | Status: AC
  Administered 2015-03-17: 4 mg via INTRAVENOUS
  Filled 2015-03-17: qty 2

## 2015-03-17 MED ORDER — MORPHINE SULFATE 4 MG/ML IJ SOLN
4.0000 mg | Freq: Once | INTRAMUSCULAR | Status: AC
  Administered 2015-03-17: 4 mg via INTRAVENOUS
  Filled 2015-03-17: qty 1

## 2015-03-17 MED ORDER — HYDROCODONE-ACETAMINOPHEN 5-325 MG PO TABS: 1.0000 | ORAL_TABLET | Freq: Four times a day (QID) | ORAL | Status: DC | PRN

## 2015-03-17 NOTE — ED Provider Notes (Signed)
CSN: [id number]     Arrival date & time 03/17/15  1631 History   First MD Initiated Contact with Patient 03/17/15 1641     Chief Complaint  Patient presents with  . Flank Pain     (Consider location/radiation/quality/duration/timing/severity/associated sxs/prior Treatment) HPI Comments: Pt comes in with left flank pain for the last 2 hours. Denies fever, vomiting or abdominal pain. States similar to previous stone. She states that she is have urgency but then can't urinate. Having nausea.. Her cd4 count is 400 and viral load in undectable  The history is provided by the patient. No language interpreter was used.    Past Medical History  Diagnosis Date  . HIV (human immunodeficiency virus infection)   . TMJ syndrome   . Simple seizure     r/t taking tramadol  . Ovarian cyst   . Kidney stone   . Anxiety   . Seizures   . Thrombocytopenia   . Hepatitis C    Past Surgical History  Procedure Laterality Date  . Appendectomy    . Cesarean section    . Ankle surgery    . Tubal ligation    . Fracture surgery    . Lithotripsy    . Abdominal hysterectomy     No family history on file. History  Substance Use Topics  . Smoking status: Current Every Day Smoker -- 0.50 packs/day    Types: Cigarettes  . Smokeless tobacco: Never Used  . Alcohol Use: No   OB History    No data available     Review of Systems  All other systems reviewed and are negative.     Allergies  Penicillins; Naproxen; and Tramadol  Home Medications   Prior to Admission medications   Medication Sig Start Date End Date Taking? Authorizing Provider  Aspirin-Acetaminophen-Caffeine (GOODY HEADACHE PO) Take 1 packet by mouth daily as needed. For pain    Historical Provider, MD  benzonatate (TESSALON) 100 MG capsule Take 1 capsule (100 mg total) by mouth every 8 (eight) hours. 05/26/13   Antony MaduraKelly Humes, PA-C  Darunavir Ethanolate (PREZISTA PO) Take 1 tablet by mouth daily.    Historical Provider, MD   emtricitabine-tenofovir (TRUVADA) 200-300 MG per tablet Take 1 tablet by mouth 2 (two) times daily. HIV medication.  Consult needed.    Historical Provider, MD  HYDROcodone-acetaminophen (NORCO/VICODIN) 5-325 MG per tablet Take 2 tablets by mouth every 4 (four) hours as needed. 12/22/14   Eber HongBrian Miller, MD  HYDROcodone-acetaminophen (NORCO/VICODIN) 5-325 MG per tablet Take 1-2 tablets by mouth every 4 (four) hours as needed for moderate pain or severe pain. 02/02/15   Arby BarretteMarcy Pfeiffer, MD  HYDROmorphone (DILAUDID) 4 MG tablet Take 1 tablet (4 mg total) by mouth every 4 (four) hours as needed for severe pain (for pain). 11/13/14   John Molpus, MD  ondansetron (ZOFRAN ODT) 8 MG disintegrating tablet Take 1 tablet (8 mg total) by mouth every 8 (eight) hours as needed for nausea or vomiting. 11/13/14   Paula LibraJohn Molpus, MD  ritonavir (NORVIR) 100 MG capsule Take 100 mg by mouth 2 (two) times daily. HIV medication.  Consult needed.    Historical Provider, MD   BP 127/86 mmHg  Pulse 104  Temp(Src) 98.4 F (36.9 C) (Oral)  Resp 18  Ht 5\' 5"  (1.651 m)  Wt 160 lb (72.576 kg)  BMI 26.63 kg/m2  SpO2 100%  LMP 06/25/2012 Physical Exam  Constitutional: She is oriented to person, place, and time. She appears well-developed and well-nourished.  Cardiovascular: Normal rate and regular rhythm.   Pulmonary/Chest: Effort normal and breath sounds normal.  Abdominal: Soft. Bowel sounds are normal.  Musculoskeletal: Normal range of motion.  Neurological: She is alert and oriented to person, place, and time.  Skin: Skin is warm and dry.  Psychiatric: She has a normal mood and affect.  Nursing note and vitals reviewed.   ED Course  Procedures (including critical care time) Labs Review Labs Reviewed  URINALYSIS, ROUTINE W REFLEX MICROSCOPIC (NOT AT [facility]) - Abnormal; Notable for the following:    Hgb urine dipstick LARGE (*)    All other components within normal limits  BASIC METABOLIC PANEL - Abnormal; Notable for  the following:    CO2 21 (*)    Glucose, Bld 110 (*)    All other components within normal limits  URINE MICROSCOPIC-ADD ON    Imaging Review Dg Abd 1 View  03/17/2015   CLINICAL DATA:  Left lower flank pain.  Nausea.  EXAM: ABDOMEN - 1 VIEW  COMPARISON:  11/13/2014  FINDINGS: The bowel gas pattern is normal. No radio-opaque calculi or other significant radiographic abnormality are seen.  IMPRESSION: 1. The patient has known tiny punctate 1-2 mm calculi in the kidneys on the prior CT scan from 11/13/2014, but these are not readily visible on today's conventional radiographs. Bowel gas pattern normal.   Electronically Signed   By: Gaylyn Rong M.D.   On: 03/17/2015 18:18     EKG Interpretation None      MDM   Final diagnoses:  Hematuria  Flank pain    No definite stone noted on kub. No infection in urine. Pt send home with hydrocodone and encouraged follow up with urology    Teressa Lower, NP 03/17/15 1610  Blake Divine, MD 03/17/15 1945

## 2015-03-17 NOTE — ED Notes (Signed)
Patient here with left flank pain x 2 hours, reports that it feels like a stone

## 2015-03-17 NOTE — Discharge Instructions (Signed)
Follow up with your urologist for continued or worsening symptoms Flank Pain Flank pain refers to pain that is located on the side of the body between the upper abdomen and the back. The pain may occur over a short period of time (acute) or may be long-term or reoccurring (chronic). It may be mild or severe. Flank pain can be caused by many things. CAUSES  Some of the more common causes of flank pain include:  Muscle strains.   Muscle spasms.   A disease of your spine (vertebral disk disease).   A lung infection (pneumonia).   Fluid around your lungs (pulmonary edema).   A kidney infection.   Kidney stones.   A very painful skin rash caused by the chickenpox virus (shingles).   Gallbladder disease.  HOME CARE INSTRUCTIONS  Home care will depend on the cause of your pain. In general,  Rest as directed by your caregiver.  Drink enough fluids to keep your urine clear or pale yellow.  Only take over-the-counter or prescription medicines as directed by your caregiver. Some medicines may help relieve the pain.  Tell your caregiver about any changes in your pain.  Follow up with your caregiver as directed. SEEK IMMEDIATE MEDICAL CARE IF:   Your pain is not controlled with medicine.   You have new or worsening symptoms.  Your pain increases.   You have abdominal pain.   You have shortness of breath.   You have persistent nausea or vomiting.   You have swelling in your abdomen.   You feel faint or pass out.   You have blood in your urine.  You have a fever or persistent symptoms for more than 2-3 days.  You have a fever and your symptoms suddenly get worse. MAKE SURE YOU:   Understand these instructions.  Will watch your condition.  Will get help right away if you are not doing well or get worse. Document Released: 10/27/2005 Document Revised: 05/30/2012 Document Reviewed: 04/19/2012 [facility] Patient Information 2015 [place], [place]. This  information is not intended to replace advice given to you by your health care provider. Make sure you discuss any questions you have with your health care provider.

## 2015-04-21 ENCOUNTER — Emergency Department (HOSPITAL_BASED_OUTPATIENT_CLINIC_OR_DEPARTMENT_OTHER)
Admission: EM | Admit: 2015-04-21 | Discharge: 2015-04-21 | Disposition: A | Discharge status: Home / Self Care | Payer: Medicaid Other | Attending: Emergency Medicine | Admitting: Emergency Medicine

## 2015-04-21 ENCOUNTER — Encounter (HOSPITAL_BASED_OUTPATIENT_CLINIC_OR_DEPARTMENT_OTHER): Payer: Self-pay | Admitting: Emergency Medicine

## 2015-04-21 DIAGNOSIS — R109 Unspecified abdominal pain: Secondary | ICD-10-CM | POA: Insufficient documentation

## 2015-04-21 DIAGNOSIS — Z87442 Personal history of urinary calculi: Secondary | ICD-10-CM | POA: Diagnosis not present

## 2015-04-21 DIAGNOSIS — Z880 Allergy status to penicillin: Secondary | ICD-10-CM | POA: Insufficient documentation

## 2015-04-21 DIAGNOSIS — Z8659 Personal history of other mental and behavioral disorders: Secondary | ICD-10-CM | POA: Diagnosis not present

## 2015-04-21 DIAGNOSIS — Z862 Personal history of diseases of the blood and blood-forming organs and certain disorders involving the immune mechanism: Secondary | ICD-10-CM | POA: Diagnosis not present

## 2015-04-21 DIAGNOSIS — Z8619 Personal history of other infectious and parasitic diseases: Secondary | ICD-10-CM | POA: Diagnosis not present

## 2015-04-21 DIAGNOSIS — Z8739 Personal history of other diseases of the musculoskeletal system and connective tissue: Secondary | ICD-10-CM | POA: Diagnosis not present

## 2015-04-21 DIAGNOSIS — Z90710 Acquired absence of both cervix and uterus: Secondary | ICD-10-CM | POA: Diagnosis not present

## 2015-04-21 DIAGNOSIS — Z9851 Tubal ligation status: Secondary | ICD-10-CM | POA: Insufficient documentation

## 2015-04-21 DIAGNOSIS — Z720 Tobacco use: Secondary | ICD-10-CM | POA: Diagnosis not present

## 2015-04-21 DIAGNOSIS — Z9089 Acquired absence of other organs: Secondary | ICD-10-CM | POA: Insufficient documentation

## 2015-04-21 DIAGNOSIS — Z79899 Other long term (current) drug therapy: Secondary | ICD-10-CM | POA: Diagnosis not present

## 2015-04-21 DIAGNOSIS — Z8742 Personal history of other diseases of the female genital tract: Secondary | ICD-10-CM | POA: Insufficient documentation

## 2015-04-21 DIAGNOSIS — G40909 Epilepsy, unspecified, not intractable, without status epilepticus: Secondary | ICD-10-CM | POA: Insufficient documentation

## 2015-04-21 DIAGNOSIS — Z21 Asymptomatic human immunodeficiency virus [HIV] infection status: Secondary | ICD-10-CM | POA: Diagnosis not present

## 2015-04-21 LAB — URINALYSIS, ROUTINE W REFLEX MICROSCOPIC
Bilirubin Urine: NEGATIVE
Glucose, UA: NEGATIVE mg/dL
KETONES UR: NEGATIVE mg/dL
Leukocytes, UA: NEGATIVE
Nitrite: NEGATIVE
PH: 6 (ref 5.0–8.0)
Protein, ur: NEGATIVE mg/dL
Specific Gravity, Urine: 1.019 (ref 1.005–1.030)
Urobilinogen, UA: 0.2 mg/dL (ref 0.0–1.0)

## 2015-04-21 LAB — I-STAT CHEM 8, ED
BUN: 9 mg/dL (ref 6–20)
CREATININE: 1 mg/dL (ref 0.44–1.00)
Calcium, Ion: 1.13 mmol/L (ref 1.12–1.23)
Chloride: 107 mmol/L (ref 101–111)
Glucose, Bld: 85 mg/dL (ref 65–99)
HCT: 43 % (ref 36.0–46.0)
HEMOGLOBIN: 14.6 g/dL (ref 12.0–15.0)
Potassium: 3.8 mmol/L (ref 3.5–5.1)
Sodium: 140 mmol/L (ref 135–145)
TCO2: 20 mmol/L (ref 0–100)

## 2015-04-21 LAB — URINE MICROSCOPIC-ADD ON

## 2015-04-21 NOTE — ED Provider Notes (Signed)
CSN: [id number]     Arrival date & time 04/21/15  1530 History   First MD Initiated Contact with Patient 04/21/15 1556     Chief Complaint  Patient presents with  . Flank Pain     (Consider location/radiation/quality/duration/timing/severity/associated sxs/prior Treatment) Patient is a 29 y.o. female presenting with flank pain. The history is provided by the patient.  Flank Pain This is a new problem. The current episode started 1 to 2 hours ago. The problem occurs constantly. The problem has not changed since onset.Pertinent negatives include no abdominal pain. Nothing aggravates the symptoms. Nothing relieves the symptoms. She has tried acetaminophen for the symptoms. The treatment provided no relief.    Past Medical History  Diagnosis Date  . HIV (human immunodeficiency virus infection)   . TMJ syndrome   . Simple seizure     r/t taking tramadol  . Ovarian cyst   . Kidney stone   . Anxiety   . Seizures   . Thrombocytopenia   . Hepatitis C    Past Surgical History  Procedure Laterality Date  . Appendectomy    . Cesarean section    . Ankle surgery    . Tubal ligation    . Fracture surgery    . Lithotripsy    . Abdominal hysterectomy     History reviewed. No pertinent family history. History  Substance Use Topics  . Smoking status: Current Every Day Smoker -- 0.50 packs/day    Types: Cigarettes  . Smokeless tobacco: Never Used  . Alcohol Use: No   OB History    No data available     Review of Systems  Gastrointestinal: Negative for abdominal pain.  Genitourinary: Positive for flank pain.  All other systems reviewed and are negative.     Allergies  Penicillins; Naproxen; and Tramadol  Home Medications   Prior to Admission medications   Medication Sig Start Date End Date Taking? Authorizing Provider  Aspirin-Acetaminophen-Caffeine (GOODY HEADACHE PO) Take 1 packet by mouth daily as needed. For pain    Historical Provider, MD  benzonatate (TESSALON) 100  MG capsule Take 1 capsule (100 mg total) by mouth every 8 (eight) hours. 05/26/13   Antony Madura, PA-C  Darunavir Ethanolate (PREZISTA PO) Take 1 tablet by mouth daily.    Historical Provider, MD  emtricitabine-tenofovir (TRUVADA) 200-300 MG per tablet Take 1 tablet by mouth 2 (two) times daily. HIV medication.  Consult needed.    Historical Provider, MD  HYDROcodone-acetaminophen (NORCO/VICODIN) 5-325 MG per tablet Take 2 tablets by mouth every 4 (four) hours as needed. 12/22/14   Eber Hong, MD  HYDROcodone-acetaminophen (NORCO/VICODIN) 5-325 MG per tablet Take 1-2 tablets by mouth every 4 (four) hours as needed for moderate pain or severe pain. 02/02/15   Arby Barrette, MD  HYDROcodone-acetaminophen (NORCO/VICODIN) 5-325 MG per tablet Take 1-2 tablets by mouth every 6 (six) hours as needed. 03/17/15   Teressa Lower, NP  HYDROmorphone (DILAUDID) 4 MG tablet Take 1 tablet (4 mg total) by mouth every 4 (four) hours as needed for severe pain (for pain). 11/13/14   John Molpus, MD  ondansetron (ZOFRAN ODT) 8 MG disintegrating tablet Take 1 tablet (8 mg total) by mouth every 8 (eight) hours as needed for nausea or vomiting. 11/13/14   Paula Libra, MD  ritonavir (NORVIR) 100 MG capsule Take 100 mg by mouth 2 (two) times daily. HIV medication.  Consult needed.    Historical Provider, MD   BP 94/56 mmHg  Pulse 62  Temp(Src) 98.2  F (36.8 C) (Oral)  Resp 18  Ht 5\' 5"  (1.651 m)  Wt 160 lb (72.576 kg)  BMI 26.63 kg/m2  SpO2 99%  LMP 06/25/2012 Physical Exam  Constitutional: She is oriented to person, place, and time. She appears well-developed and well-nourished. No distress.  HENT:  Head: Normocephalic.  Eyes: Conjunctivae are normal.  Neck: Neck supple. No tracheal deviation present.  Cardiovascular: Normal rate and regular rhythm.   Pulmonary/Chest: Effort normal. No respiratory distress.  Abdominal: Soft. She exhibits no distension. There is tenderness (over left flank). There is no rebound and  no guarding.  Neurological: She is alert and oriented to person, place, and time.  Skin: Skin is warm and dry. No rash noted.  Psychiatric: She has a normal mood and affect.    ED Course  Procedures (including critical care time) Emergency Focused Ultrasound Exam Limited Retroperitoneal Ultrasound of Kidneys  Performed and interpreted by Dr. Clydene Pugh Focused abdominal ultrasound with both kidneys imaged in transverse and longitudinal planes in real-time. Indication: flank pain Findings: bilateral kidneys present, no shadowing, no anechoic areas Interpretation: no hydronephrosis visualized.  no stones or cysts visualized  Images archived electronically  CPT Code: 16109   Emergency Focused Ultrasound Exam Limited ultrasound of the Bladder.   Performed and interpreted by Dr. Clydene Pugh Indication: evaluation for urinary retention Transverse and Sagittal views of the bladder are obtained and calculations are performed to determine an estimated bladder volume for signs of post-renal obstruction.  Findings: Bladder is empty Interpretation: no evidence of outlet obstruction Images archived electronically.  CPT Codes: 60454 and [phone]   Labs Review Labs Reviewed  URINALYSIS, ROUTINE W REFLEX MICROSCOPIC (NOT AT [facility]) - Abnormal; Notable for the following:    Hgb urine dipstick LARGE (*)    All other components within normal limits  URINE MICROSCOPIC-ADD ON  I-STAT CHEM 8, ED    Imaging Review No results found.   EKG Interpretation None      MDM   Final diagnoses:  Left flank pain    29 year old female presents with left flank pain that she states has been going on for the last 24 hours. She has multiple visits at no vomiting facilities, [facility] facilities, [facility], and our facilities including 5 visits to the current health group in the last 6 months for all pain related complaints and has been noted to have demonstrated pain medication seeking behavior on  prior visits. She has had numerous CT scans of her abdomen which have demonstrated punctate intrarenal stones but has never had any obstructive nephrolithiasis that should cause her pain. Bedside ultrasound today again demonstrates no evidence of hydronephrosis and her pain is unlikely to be caused by a kidney stone. No evidence of infection in urine today.  I discussed Tylenol use for her pain and establishment of a primary care provider to prevent ongoing abuse of emergency department system in the future.    Lyndal Pulley, MD 04/21/15 [phone]

## 2015-04-21 NOTE — ED Notes (Addendum)
Patient state that she has had pain to her left flank / left lower back pain since last night. The patient reports that she is having nausea as well. Also reports "blood in her urine"  Patient states that the pain is worse with movement.

## 2015-04-21 NOTE — Discharge Instructions (Signed)
Abdominal Pain, Women °Abdominal (stomach, pelvic, or belly) pain can be caused by many things. It is important to tell your doctor: °· The location of the pain. °· Does it come and go or is it present all the time? °· Are there things that start the pain (eating certain foods, exercise)? °· Are there other symptoms associated with the pain (fever, nausea, vomiting, diarrhea)? °All of this is helpful to know when trying to find the cause of the pain. °CAUSES  °· Stomach: virus or bacteria infection, or ulcer. °· Intestine: appendicitis (inflamed appendix), regional ileitis (Crohn's disease), ulcerative colitis (inflamed colon), irritable bowel syndrome, diverticulitis (inflamed diverticulum of the colon), or cancer of the stomach or intestine. °· Gallbladder disease or stones in the gallbladder. °· Kidney disease, kidney stones, or infection. °· Pancreas infection or cancer. °· Fibromyalgia (pain disorder). °· Diseases of the female organs: °¨ Uterus: fibroid (non-cancerous) tumors or infection. °¨ Fallopian tubes: infection or tubal pregnancy. °¨ Ovary: cysts or tumors. °¨ Pelvic adhesions (scar tissue). °¨ Endometriosis (uterus lining tissue growing in the pelvis and on the pelvic organs). °¨ Pelvic congestion syndrome (female organs filling up with blood just before the menstrual period). °¨ Pain with the menstrual period. °¨ Pain with ovulation (producing an egg). °¨ Pain with an IUD (intrauterine device, birth control) in the uterus. °¨ Cancer of the female organs. °· Functional pain (pain not caused by a disease, may improve without treatment). °· Psychological pain. °· Depression. °DIAGNOSIS  °Your doctor will decide the seriousness of your pain by doing an examination. °· Blood tests. °· X-rays. °· Ultrasound. °· CT scan (computed tomography, special type of X-ray). °· MRI (magnetic resonance imaging). °· Cultures, for infection. °· Barium enema (dye inserted in the large intestine, to better view it with  X-rays). °· Colonoscopy (looking in intestine with a lighted tube). °· Laparoscopy (minor surgery, looking in abdomen with a lighted tube). °· Major abdominal exploratory surgery (looking in abdomen with a large incision). °TREATMENT  °The treatment will depend on the cause of the pain.  °· Many cases can be observed and treated at home. °· Over-the-counter medicines recommended by your caregiver. °· Prescription medicine. °· Antibiotics, for infection. °· Birth control pills, for painful periods or for ovulation pain. °· Hormone treatment, for endometriosis. °· Nerve blocking injections. °· Physical therapy. °· Antidepressants. °· Counseling with a psychologist or psychiatrist. °· Minor or major surgery. °HOME CARE INSTRUCTIONS  °· Do not take laxatives, unless directed by your caregiver. °· Take over-the-counter pain medicine only if ordered by your caregiver. Do not take aspirin because it can cause an upset stomach or bleeding. °· Try a clear liquid diet (broth or water) as ordered by your caregiver. Slowly move to a bland diet, as tolerated, if the pain is related to the stomach or intestine. °· Have a thermometer and take your temperature several times a day, and record it. °· Bed rest and sleep, if it helps the pain. °· Avoid sexual intercourse, if it causes pain. °· Avoid stressful situations. °· Keep your follow-up appointments and tests, as your caregiver orders. °· If the pain does not go away with medicine or surgery, you may try: °¨ Acupuncture. °¨ Relaxation exercises (yoga, meditation). °¨ Group therapy. °¨ Counseling. °SEEK MEDICAL CARE IF:  °· You notice certain foods cause stomach pain. °· Your home care treatment is not helping your pain. °· You need stronger pain medicine. °· You want your IUD removed. °· You feel faint or   lightheaded. °· You develop nausea and vomiting. °· You develop a rash. °· You are having side effects or an allergy to your medicine. °SEEK IMMEDIATE MEDICAL CARE IF:  °· Your  pain does not go away or gets worse. °· You have a fever. °· Your pain is felt only in portions of the abdomen. The right side could possibly be appendicitis. The left lower portion of the abdomen could be colitis or diverticulitis. °· You are passing blood in your stools (bright red or black tarry stools, with or without vomiting). °· You have blood in your urine. °· You develop chills, with or without a fever. °· You pass out. °MAKE SURE YOU:  °· Understand these instructions. °· Will watch your condition. °· Will get help right away if you are not doing well or get worse. °Document Released: 07/03/2007 Document Revised: 01/20/2014 Document Reviewed: 07/23/2009 °ExitCare® Patient Information ©2015 [employer], LLC. This information is not intended to replace advice given to you by your health care provider. Make sure you discuss any questions you have with your health care provider. ° °

## 2016-01-10 ENCOUNTER — Encounter (HOSPITAL_BASED_OUTPATIENT_CLINIC_OR_DEPARTMENT_OTHER): Payer: Self-pay | Admitting: *Deleted

## 2016-01-10 ENCOUNTER — Emergency Department (HOSPITAL_BASED_OUTPATIENT_CLINIC_OR_DEPARTMENT_OTHER)
Admission: EM | Admit: 2016-01-10 | Discharge: 2016-01-10 | Disposition: A | Discharge status: Home / Self Care | Payer: Medicaid Other | Attending: Emergency Medicine | Admitting: Emergency Medicine

## 2016-01-10 ENCOUNTER — Emergency Department (HOSPITAL_BASED_OUTPATIENT_CLINIC_OR_DEPARTMENT_OTHER): Payer: Medicaid Other

## 2016-01-10 DIAGNOSIS — Z8669 Personal history of other diseases of the nervous system and sense organs: Secondary | ICD-10-CM | POA: Insufficient documentation

## 2016-01-10 DIAGNOSIS — R110 Nausea: Secondary | ICD-10-CM | POA: Diagnosis not present

## 2016-01-10 DIAGNOSIS — Z3202 Encounter for pregnancy test, result negative: Secondary | ICD-10-CM | POA: Insufficient documentation

## 2016-01-10 DIAGNOSIS — Z8739 Personal history of other diseases of the musculoskeletal system and connective tissue: Secondary | ICD-10-CM | POA: Insufficient documentation

## 2016-01-10 DIAGNOSIS — F1410 Cocaine abuse, uncomplicated: Secondary | ICD-10-CM | POA: Insufficient documentation

## 2016-01-10 DIAGNOSIS — Z79899 Other long term (current) drug therapy: Secondary | ICD-10-CM | POA: Insufficient documentation

## 2016-01-10 DIAGNOSIS — Z862 Personal history of diseases of the blood and blood-forming organs and certain disorders involving the immune mechanism: Secondary | ICD-10-CM | POA: Diagnosis not present

## 2016-01-10 DIAGNOSIS — B20 Human immunodeficiency virus [HIV] disease: Secondary | ICD-10-CM | POA: Insufficient documentation

## 2016-01-10 DIAGNOSIS — Z880 Allergy status to penicillin: Secondary | ICD-10-CM | POA: Diagnosis not present

## 2016-01-10 DIAGNOSIS — Z87442 Personal history of urinary calculi: Secondary | ICD-10-CM | POA: Insufficient documentation

## 2016-01-10 DIAGNOSIS — Z9049 Acquired absence of other specified parts of digestive tract: Secondary | ICD-10-CM | POA: Insufficient documentation

## 2016-01-10 DIAGNOSIS — Z9851 Tubal ligation status: Secondary | ICD-10-CM | POA: Diagnosis not present

## 2016-01-10 DIAGNOSIS — Z90710 Acquired absence of both cervix and uterus: Secondary | ICD-10-CM | POA: Insufficient documentation

## 2016-01-10 DIAGNOSIS — Z8742 Personal history of other diseases of the female genital tract: Secondary | ICD-10-CM | POA: Insufficient documentation

## 2016-01-10 DIAGNOSIS — F17210 Nicotine dependence, cigarettes, uncomplicated: Secondary | ICD-10-CM | POA: Diagnosis not present

## 2016-01-10 DIAGNOSIS — R1012 Left upper quadrant pain: Secondary | ICD-10-CM | POA: Insufficient documentation

## 2016-01-10 LAB — COMPREHENSIVE METABOLIC PANEL
ALT: 48 U/L (ref 14–54)
AST: 34 U/L (ref 15–41)
Albumin: 4.5 g/dL (ref 3.5–5.0)
Alkaline Phosphatase: 63 U/L (ref 38–126)
Anion gap: 10 (ref 5–15)
BUN: 11 mg/dL (ref 6–20)
CO2: 21 mmol/L — ABNORMAL LOW (ref 22–32)
Calcium: 9.3 mg/dL (ref 8.9–10.3)
Chloride: 104 mmol/L (ref 101–111)
Creatinine, Ser: 0.96 mg/dL (ref 0.44–1.00)
GFR calc Af Amer: >60 mL/min (ref >60)
GFR calc non Af Amer: >60 mL/min (ref >60)
Glucose, Bld: 84 mg/dL (ref 65–99)
Potassium: 3.7 mmol/L (ref 3.5–5.1)
Sodium: 135 mmol/L (ref 135–145)
Total Bilirubin: 1.2 mg/dL (ref 0.3–1.2)
Total Protein: 7.9 g/dL (ref 6.5–8.1)

## 2016-01-10 LAB — URINALYSIS, ROUTINE W REFLEX MICROSCOPIC
BILIRUBIN URINE: NEGATIVE
Glucose, UA: NEGATIVE mg/dL
HGB URINE DIPSTICK: NEGATIVE
KETONES UR: NEGATIVE mg/dL
Leukocytes, UA: NEGATIVE
Nitrite: NEGATIVE
Protein, ur: NEGATIVE mg/dL
Specific Gravity, Urine: 1.023 (ref 1.005–1.030)
pH: 6 (ref 5.0–8.0)

## 2016-01-10 LAB — CBC WITH DIFFERENTIAL/PLATELET
Basophils Absolute: 0 10*3/uL (ref 0.0–0.1)
Basophils Relative: 0 %
Eosinophils Absolute: 0.1 10*3/uL (ref 0.0–0.7)
Eosinophils Relative: 1 %
HCT: 46.2 % — ABNORMAL HIGH (ref 36.0–46.0)
Hemoglobin: 16.3 g/dL — ABNORMAL HIGH (ref 12.0–15.0)
Lymphocytes Relative: 15 %
Lymphs Abs: 1 10*3/uL (ref 0.7–4.0)
MCH: 30.1 pg (ref 26.0–34.0)
MCHC: 35.3 g/dL (ref 30.0–36.0)
MCV: 85.4 fL (ref 78.0–100.0)
Monocytes Absolute: 0.5 10*3/uL (ref 0.1–1.0)
Monocytes Relative: 7 %
Neutro Abs: 4.7 10*3/uL (ref 1.7–7.7)
Neutrophils Relative %: 76 %
Platelets: DECREASED 10*3/uL (ref 150–400)
RBC: 5.41 MIL/uL — ABNORMAL HIGH (ref 3.87–5.11)
RDW: 12.7 % (ref 11.5–15.5)
WBC: 6.2 10*3/uL (ref 4.0–10.5)

## 2016-01-10 LAB — PREGNANCY, URINE: Preg Test, Ur: NEGATIVE

## 2016-01-10 LAB — RAPID URINE DRUG SCREEN, HOSP PERFORMED
Amphetamines: NOT DETECTED
Barbiturates: NOT DETECTED
Benzodiazepines: NOT DETECTED
Cocaine: POSITIVE — AB
Opiates: NOT DETECTED
Tetrahydrocannabinol: NOT DETECTED

## 2016-01-10 LAB — LIPASE, BLOOD: Lipase: 25 U/L (ref 11–51)

## 2016-01-10 MED ORDER — IOPAMIDOL (ISOVUE-300) INJECTION 61%
100.0000 mL | Freq: Once | INTRAVENOUS | Status: AC | PRN
Start: 2016-01-10 — End: 2016-01-10
  Administered 2016-01-10: 100 mL via INTRAVENOUS

## 2016-01-10 MED ORDER — ONDANSETRON HCL 4 MG/2ML IJ SOLN
4.0000 mg | Freq: Once | INTRAMUSCULAR | Status: AC
  Administered 2016-01-10: 4 mg via INTRAVENOUS
  Filled 2016-01-10: qty 2

## 2016-01-10 MED ORDER — KETOROLAC TROMETHAMINE 15 MG/ML IJ SOLN: 15.0000 mg | Freq: Once | INTRAMUSCULAR | Status: DC

## 2016-01-10 MED ORDER — MORPHINE SULFATE (PF) 4 MG/ML IV SOLN
4.0000 mg | Freq: Once | INTRAVENOUS | Status: AC
  Administered 2016-01-10: 4 mg via INTRAVENOUS
  Filled 2016-01-10: qty 1

## 2016-01-10 NOTE — Discharge Instructions (Signed)
Please follow-up with your hematology specialist for reevaluation and further management of your platelets. Abdominal Pain, Adult Many things can cause abdominal pain. Usually, abdominal pain is not caused by a disease and will improve without treatment. It can often be observed and treated at home. Your health care provider will do a physical exam and possibly order blood tests and X-rays to help determine the seriousness of your pain. However, in many cases, more time must pass before a clear cause of the pain can be found. Before that point, your health care provider may not know if you need more testing or further treatment. HOME CARE INSTRUCTIONS Monitor your abdominal pain for any changes. The following actions may help to alleviate any discomfort you are experiencing:  Only take over-the-counter or prescription medicines as directed by your health care provider.  Do not take laxatives unless directed to do so by your health care provider.  Try a clear liquid diet (broth, tea, or water) as directed by your health care provider. Slowly move to a bland diet as tolerated. SEEK MEDICAL CARE IF:  You have unexplained abdominal pain.  You have abdominal pain associated with nausea or diarrhea.  You have pain when you urinate or have a bowel movement.  You experience abdominal pain that wakes you in the night.  You have abdominal pain that is worsened or improved by eating food.  You have abdominal pain that is worsened with eating fatty foods.  You have a fever. SEEK IMMEDIATE MEDICAL CARE IF:  Your pain does not go away within 2 hours.  You keep throwing up (vomiting).  Your pain is felt only in portions of the abdomen, such as the right side or the left lower portion of the abdomen.  You pass bloody or black tarry stools. MAKE SURE YOU:  Understand these instructions.  Will watch your condition.  Will get help right away if you are not doing well or get worse.   This  information is not intended to replace advice given to you by your health care provider. Make sure you discuss any questions you have with your health care provider.   Document Released: 06/15/2005 Document Revised: 05/27/2015 Document Reviewed: 05/15/2013 Elsevier Interactive Patient Education [employer]2016 Elsevier Inc.

## 2016-01-10 NOTE — ED Notes (Signed)
RN attempted straight stick. Patient agitated and told RN to stop while attempting to get blood sample. RN explained that if she were to stop that someone else would need to come try to get a sample, unless she was refusing further attempts. Pt states she refuses further attempts at this time. EDP notified.

## 2016-01-10 NOTE — ED Provider Notes (Signed)
CSN: [id number]     Arrival date & time 01/10/16  1917 History   First MD Initiated Contact with Patient 01/10/16 1931     Chief Complaint  Patient presents with  . Flank Pain    HPI   30 year old female presents today with complaints of right sided flank and left upper quadrant pain. Patient reports symptoms started upon awakening this morning, she describes them as sharp and stabbing with no improvement. She reports nausea, denies any vomiting or diarrhea. She denies any lower abdominal pain, vaginal bleeding or discharge. Patient reports a history of kidney stones, but reports this feels different from previous kidney stones. Patient denies any drug or alcohol use, she denies any trauma to the abdomen, abnormal food or drink. Patient reports that she is HIV positive, has had recent blood work showing normal CD4 count. She notes that she has had decreased platelets but her platelets have been normal recently. Patient reports trying Tylenol at home with no improvement in her symptoms.   Past Medical History  Diagnosis Date  . HIV (human immunodeficiency virus infection) (HCC)   . TMJ syndrome   . Simple seizure (HCC)     r/t taking tramadol  . Ovarian cyst   . Kidney stone   . Anxiety   . Seizures (HCC)   . Thrombocytopenia (HCC)   . Hepatitis C    Past Surgical History  Procedure Laterality Date  . Appendectomy    . Cesarean section    . Ankle surgery    . Tubal ligation    . Fracture surgery    . Lithotripsy    . Abdominal hysterectomy     History reviewed. No pertinent family history. Social History  Substance Use Topics  . Smoking status: Current Every Day Smoker -- 0.50 packs/day    Types: Cigarettes  . Smokeless tobacco: Never Used  . Alcohol Use: No   OB History    No data available     Review of Systems  All other systems reviewed and are negative.   Allergies  Penicillins; Naproxen; and Tramadol  Home Medications   Prior to Admission medications    Medication Sig Start Date End Date Taking? Authorizing Provider  Aspirin-Acetaminophen-Caffeine (GOODY HEADACHE PO) Take 1 packet by mouth daily as needed. For pain    Historical Provider, MD  Darunavir Ethanolate (PREZISTA PO) Take 1 tablet by mouth daily.    Historical Provider, MD  emtricitabine-tenofovir (TRUVADA) 200-300 MG per tablet Take 1 tablet by mouth 2 (two) times daily. HIV medication.  Consult needed.    Historical Provider, MD  ritonavir (NORVIR) 100 MG capsule Take 100 mg by mouth 2 (two) times daily. HIV medication.  Consult needed.    Historical Provider, MD   BP 112/67 mmHg  Pulse 72  Temp(Src) 98.1 F (36.7 C) (Oral)  Resp 20  Ht  (1.651 m)  Wt 58.968 kg  BMI 21.63 kg/m2  SpO2 100%  LMP 06/25/2012 Physical Exam  Constitutional: She is oriented to person, place, and time. She appears well-developed and well-nourished.  HENT:  Head: Normocephalic and atraumatic.  Eyes: Conjunctivae are normal. Pupils are equal, round, and reactive to light. Right eye exhibits no discharge. Left eye exhibits no discharge. No scleral icterus.  Neck: Normal range of motion. No JVD present. No tracheal deviation present.  Cardiovascular: Normal rate, regular rhythm, normal heart sounds and intact distal pulses.  Exam reveals no gallop and no friction rub.   No murmur heard. Pulmonary/Chest: Effort  normal. No stridor. No respiratory distress. She has no wheezes. She has no rales. She exhibits no tenderness.  Abdominal: Soft. She exhibits no distension and no mass. There is tenderness. There is no rebound and no guarding.  LUQ pain to palpation   Musculoskeletal: She exhibits no edema or tenderness.  Neurological: She is alert and oriented to person, place, and time. Coordination normal.  Skin: Skin is warm and dry. No rash noted. No erythema. No pallor.  Psychiatric: She has a normal mood and affect. Her behavior is normal. Judgment and thought content normal.  Nursing note and  vitals reviewed.   ED Course  Procedures (including critical care time) Labs Review Labs Reviewed  CBC WITH DIFFERENTIAL/PLATELET - Abnormal; Notable for the following:    RBC 5.41 (*)    Hemoglobin 16.3 (*)    HCT 46.2 (*)    All other components within normal limits  COMPREHENSIVE METABOLIC PANEL - Abnormal; Notable for the following:    CO2 21 (*)    All other components within normal limits  URINE RAPID DRUG SCREEN, HOSP PERFORMED - Abnormal; Notable for the following:    Cocaine POSITIVE (*)    All other components within normal limits  URINALYSIS, ROUTINE W REFLEX MICROSCOPIC ([facility])  LIPASE, BLOOD  PREGNANCY, URINE  PLATELET COUNT    Imaging Review Ct Abdomen Pelvis W Contrast  01/10/2016  CLINICAL DATA:  Acute onset of left upper quadrant abdominal pain and left flank pain. Initial encounter. EXAM: CT ABDOMEN AND PELVIS WITH CONTRAST TECHNIQUE: Multidetector CT imaging of the abdomen and pelvis was performed using the standard protocol following bolus administration of intravenous contrast. CONTRAST:  100mL ISOVUE-300 IOPAMIDOL (ISOVUE-300) INJECTION 61% COMPARISON:  CT of the abdomen and pelvis from 11/13/2014 FINDINGS: The visualized lung bases are clear. An apparent small 1.2 cm peripherally enhancing hemangioma is noted at the hepatic dome. An additional nonspecific 7 mm hypodensity is noted at the hepatic dome. The gallbladder is within normal limits. The pancreas and adrenal glands are unremarkable. The kidneys are unremarkable in appearance. There is no evidence of hydronephrosis. No renal or ureteral stones are seen. No perinephric stranding is appreciated. No free fluid is identified. The small bowel is unremarkable in appearance. The stomach is within normal limits. No acute vascular abnormalities are seen. The patient is status post appendectomy. The colon is grossly unremarkable in appearance. The bladder is mildly distended and grossly unremarkable. The patient is  status post hysterectomy. No suspicious adnexal masses are seen. No inguinal lymphadenopathy is seen. No acute osseous abnormalities are identified. IMPRESSION: 1. No acute abnormality seen within the abdomen or pelvis. 2. Small hypodensities within the liver, one of which reflects a hemangioma. Electronically Signed   By: Roanna RaiderJeffery  Chang M.D.   On: 01/10/2016 21:34   I have personally reviewed and evaluated these images and lab results as part of my medical decision-making.   EKG Interpretation None      MDM   Final diagnoses:  Left upper quadrant pain    Labs: CBC, CMP, lipase, urinalysis  Imaging: CT abdomen and pelvis  Consults:  Therapeutics: Morphine  Discharge Meds:   Assessment/Plan: 30 year old female presents today with left upper quadrant and flank pain. Patient denies any drug or alcohol use, cocaine positive urine. Patient was given pain medication pending CT results which showed no significant findings. Patient's labs were drawn and she has a history of thrombocytopenia, lab was reported as "platelets appear decreased". I spoke to lab there  is an air they needed to run the blood again. Attempts at drawing blood were unsuccessful, patient refused blood draw. Patient requesting discharge home, she will be discharged with instructions to [facility] for reevaluation of her ongoing chronic conditions. Patient had reassuring vital signs, was afebrile, symptomatic care instructions given, strict return precautions given.        Eyvonne Mechanic, PA-C 01/10/16 1610  Nelva Nay, MD 01/14/16 [id number]

## 2016-01-10 NOTE — ED Notes (Signed)
Patient transported to CT 

## 2016-01-10 NOTE — ED Notes (Signed)
RN attempted to draw blood from IV site with no success. Pt notified she would need straight stick to get blood to check platelet level. Pt verbalized understanding.

## 2016-01-10 NOTE — ED Notes (Signed)
Attempted to get blood for the platelet count and pt started complaining and wanting me to take the needle out before the process could be completed.

## 2016-01-10 NOTE — ED Notes (Signed)
RN spoke with pt about unknown platelet level. RN informed patient that without labs we cannot be sure her platelet level is not at a dangerous level, needing further care. Patient verbalizes understanding and states " yeah, I see my doctor this week anyway".

## 2016-01-10 NOTE — ED Notes (Signed)
Pt reports left flank pain since this morning.  Denies urinary symptoms.

## 2016-03-05 ENCOUNTER — Emergency Department (HOSPITAL_BASED_OUTPATIENT_CLINIC_OR_DEPARTMENT_OTHER): Payer: Medicaid Other

## 2016-03-05 ENCOUNTER — Encounter (HOSPITAL_BASED_OUTPATIENT_CLINIC_OR_DEPARTMENT_OTHER): Payer: Self-pay | Admitting: Emergency Medicine

## 2016-03-05 ENCOUNTER — Emergency Department (HOSPITAL_BASED_OUTPATIENT_CLINIC_OR_DEPARTMENT_OTHER)
Admission: EM | Admit: 2016-03-05 | Discharge: 2016-03-05 | Disposition: A | Discharge status: Home / Self Care | Payer: Medicaid Other | Attending: Emergency Medicine | Admitting: Emergency Medicine

## 2016-03-05 DIAGNOSIS — Z21 Asymptomatic human immunodeficiency virus [HIV] infection status: Secondary | ICD-10-CM | POA: Insufficient documentation

## 2016-03-05 DIAGNOSIS — Y9389 Activity, other specified: Secondary | ICD-10-CM | POA: Diagnosis not present

## 2016-03-05 DIAGNOSIS — W2203XA Walked into furniture, initial encounter: Secondary | ICD-10-CM | POA: Insufficient documentation

## 2016-03-05 DIAGNOSIS — Y929 Unspecified place or not applicable: Secondary | ICD-10-CM | POA: Diagnosis not present

## 2016-03-05 DIAGNOSIS — Z79899 Other long term (current) drug therapy: Secondary | ICD-10-CM | POA: Diagnosis not present

## 2016-03-05 DIAGNOSIS — S62326A Displaced fracture of shaft of fifth metacarpal bone, right hand, initial encounter for closed fracture: Secondary | ICD-10-CM | POA: Diagnosis not present

## 2016-03-05 DIAGNOSIS — Y999 Unspecified external cause status: Secondary | ICD-10-CM | POA: Diagnosis not present

## 2016-03-05 DIAGNOSIS — F17210 Nicotine dependence, cigarettes, uncomplicated: Secondary | ICD-10-CM | POA: Diagnosis not present

## 2016-03-05 DIAGNOSIS — S6991XA Unspecified injury of right wrist, hand and finger(s), initial encounter: Secondary | ICD-10-CM | POA: Diagnosis present

## 2016-03-05 DIAGNOSIS — S62309A Unspecified fracture of unspecified metacarpal bone, initial encounter for closed fracture: Secondary | ICD-10-CM

## 2016-03-05 MED ORDER — OXYCODONE-ACETAMINOPHEN 5-325 MG PO TABS
1.0000 | ORAL_TABLET | Freq: Once | ORAL | Status: AC
  Administered 2016-03-05: 1 via ORAL
  Filled 2016-03-05: qty 1

## 2016-03-05 MED ORDER — OXYCODONE-ACETAMINOPHEN 5-325 MG PO TABS: 1.0000 | ORAL_TABLET | Freq: Three times a day (TID) | ORAL | Status: DC | PRN

## 2016-03-05 NOTE — ED Notes (Signed)
PA in to talk with pt. 

## 2016-03-05 NOTE — ED Provider Notes (Signed)
CSN: [id number]     Arrival date & time 03/05/16  2006 History   First MD Initiated Contact with Patient 03/05/16 2117     Chief Complaint  Patient presents with  . Hand Injury    (Consider location/radiation/quality/duration/timing/severity/associated sxs/prior Treatment) Patient is a 30 y.o. female presenting with hand injury. The history is provided by the patient and medical records. No language interpreter was used.  Hand Injury Associated symptoms: no fever    Exie Chrismer is a 31 y.o. female  with a PMH of thrombocytopenia, hep c, HIV who presents to the Emergency Department complaining of acute onset of constant left hand pain after accidentally punching her dresser. Patient notes associated swelling which began immediately following accident. Patient states she took ibuprofen and put ice on the area immediately. No other medications taken prior to arrival for symptoms. Denies any open wounds. Mild tingling to fifth digit, no other numbness or muscle weakness.   Past Medical History  Diagnosis Date  . HIV (human immunodeficiency virus infection) (HCC)   . TMJ syndrome   . Simple seizure (HCC)     r/t taking tramadol  . Ovarian cyst   . Kidney stone   . Anxiety   . Seizures (HCC)   . Thrombocytopenia (HCC)   . Hepatitis C    Past Surgical History  Procedure Laterality Date  . Appendectomy    . Cesarean section    . Ankle surgery    . Tubal ligation    . Fracture surgery    . Lithotripsy    . Abdominal hysterectomy     History reviewed. No pertinent family history. Social History  Substance Use Topics  . Smoking status: Current Every Day Smoker -- 0.50 packs/day    Types: Cigarettes  . Smokeless tobacco: Never Used  . Alcohol Use: No   OB History    No data available     Review of Systems  Constitutional: Negative for fever and chills.  HENT: Negative for congestion.   Eyes: Negative for visual disturbance.  Respiratory: Negative for cough and  shortness of breath.   Cardiovascular: Negative.   Gastrointestinal: Negative for nausea, vomiting and abdominal pain.  Musculoskeletal: Positive for arthralgias.  Skin: Positive for color change (bruising).  Allergic/Immunologic: Positive for immunocompromised state.  Neurological: Negative for numbness.      Allergies  Penicillins; Naproxen; and Tramadol  Home Medications   Prior to Admission medications   Medication Sig Start Date End Date Taking? Authorizing Provider  Darunavir Ethanolate (PREZISTA PO) Take 1 tablet by mouth daily.   Yes Historical Provider, MD  emtricitabine-tenofovir (TRUVADA) 200-300 MG per tablet Take 1 tablet by mouth 2 (two) times daily. HIV medication.  Consult needed.   Yes Historical Provider, MD  ritonavir (NORVIR) 100 MG capsule Take 100 mg by mouth 2 (two) times daily. HIV medication.  Consult needed.   Yes Historical Provider, MD  Aspirin-Acetaminophen-Caffeine (GOODY HEADACHE PO) Take 1 packet by mouth daily as needed. For pain    Historical Provider, MD  oxyCODONE-acetaminophen (PERCOCET/ROXICET) 5-325 MG tablet Take 1 tablet by mouth every 8 (eight) hours as needed for severe pain. 03/05/16   Jaime Pilcher Ward, PA-C   BP 97/72 mmHg  Pulse 99  Temp(Src) 98.6 F (37 C) (Oral)  Resp 20  Ht  (1.651 m)  Wt 56.7 kg  BMI 20.80 kg/m2  SpO2 100%  LMP 06/25/2012 Physical Exam  Constitutional: She is oriented to person, place, and time. She appears well-developed and  well-nourished.  Alert and in no acute distress  HENT:  Head: Normocephalic and atraumatic.  Neck: Neck supple. No tracheal deviation present.  Cardiovascular: Normal rate, regular rhythm and normal heart sounds.   Pulmonary/Chest: Effort normal and breath sounds normal. No respiratory distress. She has no wheezes. She has no rales.  Musculoskeletal:  TTP of 5th metacarpal. + swelling appreciated. Decreased ROM 2/2 pain. 2+ radial pulse. Sensation grossly intact.  Neurological:  She is alert and oriented to person, place, and time.  Skin: Skin is warm and dry.  No open wounds.  Nursing note and vitals reviewed.   ED Course  Procedures (including critical care time) Labs Review Labs Reviewed - No data to display  Imaging Review Dg Hand Complete Right  03/05/2016  CLINICAL DATA:  Patient reports her right hand was caught in a dresser drawer today. Pain, swelling and obvious deformity noted at head of 5th metacarpal. EXAM: RIGHT HAND - COMPLETE 3+ VIEW COMPARISON:  None. FINDINGS: There is acute fracture with angulated deformity involving the distal aspect of the fifth metacarpal. There is apex ulnar and apex dorsal angulation with otherwise only mild fracture fragment displacement. There are no other acute abnormalities. IMPRESSION: Angulated fracture involving the distal shaft of the fifth metacarpal, acute Electronically Signed   By: Esperanza Heiraymond  Rubner M.D.   On: 03/05/2016 20:35   I have personally reviewed and evaluated these images and lab results as part of my medical decision-making.   EKG Interpretation None      MDM   Final diagnoses:  Metacarpal bone fracture, closed, initial encounter   Grover CanavanKrystal Klausing presents to ED for pain along fifth digit of hand after punching a dresser today. Right upper extremity is neurovascularly intact. X-rays were obtained which show an angulated fracture along the distal fifth metacarpal. Images reviewed with attending. Placed in an ulnar gutter splint by the tech, patient reevaluated after splint placement and appears comfortable. Hand surgery follow-up given. Home care instructions given. Return precautions discussed. All questions answered.  Patient discussed with Dr. Lynelle DoctorKnapp who agrees with treatment plan.    [facility] LLCJaime Pilcher Ward, PA-C 03/05/16 2221  Linwood DibblesJon Knapp, MD 03/05/16 2222

## 2016-03-05 NOTE — ED Notes (Signed)
Pt given d/c instructions as per chart. Rx x 1. Verbalizes understanding. No questions. 

## 2016-03-05 NOTE — ED Notes (Addendum)
Pt reports that she accidentally hit her hard wood dresser with her right hand an hour ago, cause immediate pain to right  Posterior lateral hand nearl 5th metatarsal

## 2016-03-05 NOTE — Discharge Instructions (Signed)
Call the hand surgeon listed first thing Monday morning to schedule a follow-up visit. Take pain medication only as needed for severe pain- This can make you very drowsy - please do not drink alcohol, operate heavy machinery or drive on this medication. Return to ER for new or worsening symptoms, any additional concerns.

## 2016-03-05 NOTE — ED Notes (Signed)
Pt states she hit her right hand on the dresser about an hr ago. Swelling and bruising noted. Moves fingers slightly. Feels touch. Cap refill < 3 sec. Ice applied.

## 2016-06-06 ENCOUNTER — Encounter (HOSPITAL_BASED_OUTPATIENT_CLINIC_OR_DEPARTMENT_OTHER): Payer: Self-pay | Admitting: *Deleted

## 2016-06-06 ENCOUNTER — Inpatient Hospital Stay (HOSPITAL_BASED_OUTPATIENT_CLINIC_OR_DEPARTMENT_OTHER)
Admission: EM | Admit: 2016-06-06 | Discharge: 2016-06-07 | DRG: 393 | Discharge status: Against Medical Advice | Payer: Self-pay | Attending: Internal Medicine | Admitting: Internal Medicine

## 2016-06-06 ENCOUNTER — Inpatient Hospital Stay (HOSPITAL_COMMUNITY): Payer: Self-pay

## 2016-06-06 DIAGNOSIS — K625 Hemorrhage of anus and rectum: Secondary | ICD-10-CM | POA: Diagnosis present

## 2016-06-06 DIAGNOSIS — Z888 Allergy status to other drugs, medicaments and biological substances status: Secondary | ICD-10-CM

## 2016-06-06 DIAGNOSIS — D693 Immune thrombocytopenic purpura: Secondary | ICD-10-CM | POA: Diagnosis present

## 2016-06-06 DIAGNOSIS — Z90710 Acquired absence of both cervix and uterus: Secondary | ICD-10-CM

## 2016-06-06 DIAGNOSIS — F17210 Nicotine dependence, cigarettes, uncomplicated: Secondary | ICD-10-CM | POA: Diagnosis present

## 2016-06-06 DIAGNOSIS — Z9114 Patient's other noncompliance with medication regimen: Secondary | ICD-10-CM

## 2016-06-06 DIAGNOSIS — R109 Unspecified abdominal pain: Secondary | ICD-10-CM

## 2016-06-06 DIAGNOSIS — Z8619 Personal history of other infectious and parasitic diseases: Secondary | ICD-10-CM

## 2016-06-06 DIAGNOSIS — Z9049 Acquired absence of other specified parts of digestive tract: Secondary | ICD-10-CM

## 2016-06-06 DIAGNOSIS — Z880 Allergy status to penicillin: Secondary | ICD-10-CM

## 2016-06-06 DIAGNOSIS — B20 Human immunodeficiency virus [HIV] disease: Secondary | ICD-10-CM | POA: Diagnosis present

## 2016-06-06 DIAGNOSIS — D696 Thrombocytopenia, unspecified: Secondary | ICD-10-CM | POA: Diagnosis present

## 2016-06-06 DIAGNOSIS — Z87442 Personal history of urinary calculi: Secondary | ICD-10-CM

## 2016-06-06 DIAGNOSIS — F419 Anxiety disorder, unspecified: Secondary | ICD-10-CM | POA: Diagnosis present

## 2016-06-06 DIAGNOSIS — K648 Other hemorrhoids: Principal | ICD-10-CM | POA: Diagnosis present

## 2016-06-06 DIAGNOSIS — R1084 Generalized abdominal pain: Secondary | ICD-10-CM

## 2016-06-06 DIAGNOSIS — Z79899 Other long term (current) drug therapy: Secondary | ICD-10-CM

## 2016-06-06 DIAGNOSIS — Z9889 Other specified postprocedural states: Secondary | ICD-10-CM

## 2016-06-06 DIAGNOSIS — Z9851 Tubal ligation status: Secondary | ICD-10-CM

## 2016-06-06 LAB — CBC WITH DIFFERENTIAL/PLATELET
BASOS ABS: 0 10*3/uL (ref 0.0–0.1)
Basophils Relative: 0 %
EOS ABS: 0.1 10*3/uL (ref 0.0–0.7)
Eosinophils Relative: 4 %
HCT: 41.3 % (ref 36.0–46.0)
HEMOGLOBIN: 14 g/dL (ref 12.0–15.0)
LYMPHS PCT: 23 %
Lymphs Abs: 0.7 10*3/uL (ref 0.7–4.0)
MCH: 29.5 pg (ref 26.0–34.0)
MCHC: 33.9 g/dL (ref 30.0–36.0)
MCV: 86.9 fL (ref 78.0–100.0)
Monocytes Absolute: 0.4 10*3/uL (ref 0.1–1.0)
Monocytes Relative: 13 %
NEUTROS ABS: 1.8 10*3/uL (ref 1.7–7.7)
Neutrophils Relative %: 60 %
Platelets: <30 10*3/uL — CL (ref 150–400)
RBC: 4.75 MIL/uL (ref 3.87–5.11)
RDW: 12.6 % (ref 11.5–15.5)
Smear Review: DECREASED
WBC: 3 10*3/uL — ABNORMAL LOW (ref 4.0–10.5)

## 2016-06-06 LAB — MRSA PCR SCREENING: MRSA BY PCR: NEGATIVE

## 2016-06-06 LAB — COMPREHENSIVE METABOLIC PANEL
ALBUMIN: 4 g/dL (ref 3.5–5.0)
ALK PHOS: 52 U/L (ref 38–126)
ALT: 80 U/L — ABNORMAL HIGH (ref 14–54)
ANION GAP: 6 (ref 5–15)
AST: 61 U/L — AB (ref 15–41)
BUN: 14 mg/dL (ref 6–20)
CO2: 26 mmol/L (ref 22–32)
Calcium: 9.4 mg/dL (ref 8.9–10.3)
Chloride: 107 mmol/L (ref 101–111)
Creatinine, Ser: 0.87 mg/dL (ref 0.44–1.00)
GFR calc Af Amer: >60 mL/min (ref >60)
GFR calc non Af Amer: >60 mL/min (ref >60)
GLUCOSE: 82 mg/dL (ref 65–99)
POTASSIUM: 4 mmol/L (ref 3.5–5.1)
SODIUM: 139 mmol/L (ref 135–145)
Total Bilirubin: 1.1 mg/dL (ref 0.3–1.2)
Total Protein: 7.1 g/dL (ref 6.5–8.1)

## 2016-06-06 LAB — PROTIME-INR
INR: 0.91
Prothrombin Time: 12.2 s (ref 11.4–15.2)

## 2016-06-06 LAB — TYPE AND SCREEN
ABO/RH(D): O POS
ANTIBODY SCREEN: NEGATIVE

## 2016-06-06 LAB — OCCULT BLOOD X 1 CARD TO LAB, STOOL: Fecal Occult Bld: POSITIVE — AB

## 2016-06-06 LAB — LIPASE, BLOOD: Lipase: 29 U/L (ref 11–51)

## 2016-06-06 LAB — ABO/RH: ABO/RH(D): O POS

## 2016-06-06 MED ORDER — CLONAZEPAM 1 MG PO TABS
1.0000 mg | ORAL_TABLET | Freq: Two times a day (BID) | ORAL | Status: DC | PRN
  Administered 2016-06-06 – 2016-06-07 (×2): 1 mg via ORAL
  Filled 2016-06-06 (×2): qty 1

## 2016-06-06 MED ORDER — ACETAMINOPHEN 325 MG PO TABS: 650.0000 mg | ORAL_TABLET | Freq: Four times a day (QID) | ORAL | Status: DC | PRN

## 2016-06-06 MED ORDER — ONDANSETRON HCL 4 MG/2ML IJ SOLN
4.0000 mg | Freq: Four times a day (QID) | INTRAMUSCULAR | Status: DC | PRN
  Administered 2016-06-06: 4 mg via INTRAVENOUS
  Filled 2016-06-06: qty 2

## 2016-06-06 MED ORDER — MORPHINE SULFATE (PF) 4 MG/ML IV SOLN: 4.0000 mg | Freq: Once | INTRAVENOUS | Status: DC

## 2016-06-06 MED ORDER — PNEUMOCOCCAL VAC POLYVALENT 25 MCG/0.5ML IJ INJ
0.5000 mL | INJECTION | INTRAMUSCULAR | Status: DC
  Filled 2016-06-06 (×2): qty 0.5

## 2016-06-06 MED ORDER — MORPHINE SULFATE (PF) 2 MG/ML IV SOLN
2.0000 mg | INTRAVENOUS | Status: DC | PRN
  Administered 2016-06-06 (×2): 2 mg via INTRAVENOUS
  Filled 2016-06-06 (×2): qty 1

## 2016-06-06 MED ORDER — ONDANSETRON HCL 4 MG PO TABS: 4.0000 mg | ORAL_TABLET | Freq: Four times a day (QID) | ORAL | Status: DC | PRN

## 2016-06-06 MED ORDER — MORPHINE SULFATE (PF) 4 MG/ML IV SOLN
4.0000 mg | Freq: Once | INTRAVENOUS | Status: AC
  Administered 2016-06-06: 4 mg via INTRAVENOUS
  Filled 2016-06-06: qty 1

## 2016-06-06 MED ORDER — ALBUTEROL SULFATE (2.5 MG/3ML) 0.083% IN NEBU: 2.5000 mg | INHALATION_SOLUTION | RESPIRATORY_TRACT | Status: DC | PRN

## 2016-06-06 MED ORDER — OXYCODONE-ACETAMINOPHEN 5-325 MG PO TABS
1.0000 | ORAL_TABLET | Freq: Three times a day (TID) | ORAL | Status: DC | PRN
  Administered 2016-06-06 – 2016-06-07 (×3): 1 via ORAL
  Filled 2016-06-06 (×4): qty 1

## 2016-06-06 MED ORDER — MORPHINE SULFATE (PF) 2 MG/ML IV SOLN
1.0000 mg | INTRAVENOUS | Status: DC | PRN
  Administered 2016-06-06 – 2016-06-07 (×6): 1 mg via INTRAVENOUS
  Filled 2016-06-06 (×7): qty 1

## 2016-06-06 MED ORDER — SODIUM CHLORIDE 0.9% FLUSH
3.0000 mL | Freq: Two times a day (BID) | INTRAVENOUS | Status: DC
  Administered 2016-06-06 – 2016-06-07 (×2): 3 mL via INTRAVENOUS

## 2016-06-06 MED ORDER — SODIUM CHLORIDE 0.9 % IV BOLUS (SEPSIS)
500.0000 mL | Freq: Once | INTRAVENOUS | Status: AC
  Administered 2016-06-06: 500 mL via INTRAVENOUS

## 2016-06-06 MED ORDER — ONDANSETRON HCL 4 MG/2ML IJ SOLN
4.0000 mg | Freq: Once | INTRAMUSCULAR | Status: AC
  Administered 2016-06-06: 4 mg via INTRAVENOUS
  Filled 2016-06-06: qty 2

## 2016-06-06 MED ORDER — INFLUENZA VAC SPLIT QUAD 0.5 ML IM SUSY
0.5000 mL | PREFILLED_SYRINGE | INTRAMUSCULAR | Status: DC
  Filled 2016-06-06 (×2): qty 0.5

## 2016-06-06 MED ORDER — SODIUM CHLORIDE 0.9 % IV SOLN
Freq: Once | INTRAVENOUS | Status: AC
  Administered 2016-06-06: 12:00:00 via INTRAVENOUS

## 2016-06-06 MED ORDER — DICYCLOMINE HCL 10 MG PO CAPS
20.0000 mg | ORAL_CAPSULE | Freq: Once | ORAL | Status: AC
  Administered 2016-06-06: 20 mg via ORAL
  Filled 2016-06-06: qty 2

## 2016-06-06 MED ORDER — SODIUM CHLORIDE 0.9 % IV SOLN
INTRAVENOUS | Status: AC
  Administered 2016-06-06 – 2016-06-07 (×3): via INTRAVENOUS

## 2016-06-06 MED ORDER — ACETAMINOPHEN 650 MG RE SUPP: 650.0000 mg | Freq: Four times a day (QID) | RECTAL | Status: DC | PRN

## 2016-06-06 MED ORDER — DIPHENHYDRAMINE HCL 50 MG/ML IJ SOLN
25.0000 mg | Freq: Once | INTRAMUSCULAR | Status: AC
  Administered 2016-06-06: 25 mg via INTRAVENOUS
  Filled 2016-06-06: qty 1

## 2016-06-06 MED ORDER — ACETAMINOPHEN 325 MG PO TABS
650.0000 mg | ORAL_TABLET | Freq: Once | ORAL | Status: AC
  Administered 2016-06-06: 650 mg via ORAL
  Filled 2016-06-06: qty 2

## 2016-06-06 MED ORDER — METHYLPREDNISOLONE SODIUM SUCC 125 MG IJ SOLR
60.0000 mg | Freq: Every day | INTRAMUSCULAR | Status: DC
  Administered 2016-06-06 – 2016-06-07 (×2): 60 mg via INTRAVENOUS
  Filled 2016-06-06 (×2): qty 2

## 2016-06-06 NOTE — ED Provider Notes (Signed)
[facility]   CSN: [id number] Arrival date & time: 06/06/16  0303     History   Chief Complaint Chief Complaint  Patient presents with  . Rectal Bleeding    HPI Dawn Velasquez is a 30 y.o. female.PMH of HIV, thrombocytopenia, here with bleeding from her hemorrhoids.  Patient states this has been going on for over 1 week.  She does not know why She develops thrombocytopenia. She has an infectious disease appointment next week and they're thinking this is related to her HIV or related to her spleen. Patient states she has a normal CD4 count with an undetectable viral load. She is compliant with her medications. She knows that she her thrombocytopenia is getting worse when she develops bruising throughout her body and her hemorrhoids starts bleeding like it has over the past couple of days. She also complains of abdominal pain. There are no further complaints.  10 Systems reviewed and are negative for acute change except as noted in the HPI.    HPI  Past Medical History:  Diagnosis Date  . Anxiety   . Hepatitis C   . HIV (human immunodeficiency virus infection) (HCC)   . Kidney stone   . Ovarian cyst   . Seizures (HCC)   . Simple seizure (HCC)    r/t taking tramadol  . Thrombocytopenia (HCC)   . TMJ syndrome     Patient Active Problem List   Diagnosis Date Noted  . Thrombocytopenia (HCC) 06/06/2016    Past Surgical History:  Procedure Laterality Date  . ABDOMINAL HYSTERECTOMY    . ANKLE SURGERY    . APPENDECTOMY    . CESAREAN SECTION    . FRACTURE SURGERY    . LITHOTRIPSY    . TUBAL LIGATION      OB History    No data available       Home Medications    Prior to Admission medications   Medication Sig Start Date End Date Taking? Authorizing Provider  Amphetamine-Dextroamphetamine (ADDERALL PO) Take by mouth.   Yes Historical Provider, MD  ClonazePAM (KLONOPIN PO) Take by mouth.   Yes Historical Provider, MD    Aspirin-Acetaminophen-Caffeine (GOODY HEADACHE PO) Take 1 packet by mouth daily as needed. For pain    Historical Provider, MD  Darunavir Ethanolate (PREZISTA PO) Take 1 tablet by mouth daily.    Historical Provider, MD  emtricitabine-tenofovir (TRUVADA) 200-300 MG per tablet Take 1 tablet by mouth 2 (two) times daily. HIV medication.  Consult needed.    Historical Provider, MD  oxyCODONE-acetaminophen (PERCOCET/ROXICET) 5-325 MG tablet Take 1 tablet by mouth every 8 (eight) hours as needed for severe pain. 03/05/16   Jaime Pilcher Ward, PA-C  ritonavir (NORVIR) 100 MG capsule Take 100 mg by mouth 2 (two) times daily. HIV medication.  Consult needed.    Historical Provider, MD    Family History History reviewed. No pertinent family history.  Social History Social History  Substance Use Topics  . Smoking status: Current Every Day Smoker    Packs/day: 0.50    Types: Cigarettes  . Smokeless tobacco: Never Used  . Alcohol use No     Allergies   Penicillins; Naproxen; and Tramadol   Review of Systems Review of Systems   Physical Exam Updated Vital Signs BP 105/82 (BP Location: Right Arm)   Pulse 91   Temp 97.7 F (36.5 C) (Oral)   Resp 20   Ht 5\' 5"  (1.651 m)   Wt 130 lb (59 kg)  LMP 06/25/2012   SpO2 97%   BMI 21.63 kg/m   Physical Exam  Constitutional: She is oriented to person, place, and time. She appears well-developed and well-nourished. No distress.  HENT:  Head: Normocephalic and atraumatic.  Nose: Nose normal.  Mouth/Throat: Oropharynx is clear and moist. No oropharyngeal exudate.  Eyes: Conjunctivae and EOM are normal. Pupils are equal, round, and reactive to light. No scleral icterus.  Neck: Normal range of motion. Neck supple. No JVD present. No tracheal deviation present. No thyromegaly present.  Cardiovascular: Normal rate, regular rhythm and normal heart sounds.  Exam reveals no gallop and no friction rub.   No murmur heard. Pulmonary/Chest: Effort  normal and breath sounds normal. No respiratory distress. She has no wheezes. She exhibits no tenderness.  Abdominal: Soft. Bowel sounds are normal. She exhibits no distension and no mass. There is no tenderness. There is no rebound and no guarding.  Genitourinary: Rectal exam shows guaiac positive stool.  Genitourinary Comments: Moderate sized external hemorrhoid visualized. No active bleeding seen.  Musculoskeletal: Normal range of motion. She exhibits no edema or tenderness.  Lymphadenopathy:    She has no cervical adenopathy.  Neurological: She is alert and oriented to person, place, and time. No cranial nerve deficit. She exhibits normal muscle tone.  Skin: Skin is warm and dry. No rash noted. No erythema. No pallor.  Nursing note and vitals reviewed.    ED Treatments / Results  Labs (all labs ordered are listed, but only abnormal results are displayed) Labs Reviewed  CBC WITH DIFFERENTIAL/PLATELET - Abnormal; Notable for the following:       Result Value   WBC 3.0 (*)    Platelets <30 (*)    All other components within normal limits  COMPREHENSIVE METABOLIC PANEL - Abnormal; Notable for the following:    AST 61 (*)    ALT 80 (*)    All other components within normal limits  OCCULT BLOOD X 1 CARD TO LAB, STOOL - Abnormal; Notable for the following:    Fecal Occult Bld POSITIVE (*)    All other components within normal limits  PROTIME-INR    EKG  EKG Interpretation None       Radiology No results found.  Procedures Procedures (including critical care time)  Medications Ordered in ED Medications  dicyclomine (BENTYL) capsule 20 mg (20 mg Oral Given 06/06/16 0446)     Initial Impression / Assessment and Plan / ED Course  I have reviewed the triage vital signs and the nursing notes.  Pertinent labs & imaging results that were available during my care of the patient were reviewed by me and considered in my medical decision making (see chart for  details).  Clinical Course    Patient presents to the emergency department for thrombocytopenia, bruising, and bleeding and hemorrhoids. She is currently hemodynamically stable. She was given morphine and then from the emergency department for pain control. Patient is requesting a [facility] admission however they are full with multiple beds waiting in emergency department. She agreed to go to [facility] for further care. Dr. Julio Sicks accepted the patient for further care. She was given Bentyl and morphine emergency department for pain control.   CRITICAL CARE Performed by: Tomasita Crumble   Total critical care time: 45 minutes - thrombocytopenia with active bleeding requiring admission and transfusion  Critical care time was exclusive of separately billable procedures and treating other patients.  Critical care was necessary to treat or prevent imminent or life-threatening deterioration.  Critical care was time spent personally by me on the following activities: development of treatment plan with patient and/or surrogate as well as nursing, discussions with consultants, evaluation of patient's response to treatment, examination of patient, obtaining history from patient or surrogate, ordering and performing treatments and interventions, ordering and review of laboratory studies, ordering and review of radiographic studies, pulse oximetry and re-evaluation of patient's condition.  Final Clinical Impressions(s) / ED Diagnoses   Final diagnoses:  Thrombocytopenia (HCC)    New Prescriptions New Prescriptions   No medications on file     Tomasita Crumble, MD 06/06/16 [phone]

## 2016-06-06 NOTE — ED Notes (Signed)
EDP into room, at BS.  ?

## 2016-06-06 NOTE — ED Notes (Signed)
Pt back from b/r. Dr. Mora Bellmanni in to room.

## 2016-06-06 NOTE — ED Triage Notes (Addendum)
C/o rectal bleeding, onset 1500, has gone thru 3 pads in last hr, describes as bright red, relates bleeding to low platelets and hemorrhoids. Also reports bruising, nausea and sharp/stabbing abd pain. (denies: vd, dizziness, sob or fever), rates pain 10/10. No PCP. Came here b/c it is closest to her house. "OK if she needs to be transferred to [facility]". Alert, NAD, fast pressured speech, polite, cooperative. No meds PTA.

## 2016-06-06 NOTE — ED Notes (Signed)
C/o pain and nausea, no changes, alert, NAD, calm, interactive.

## 2016-06-06 NOTE — Care Management Note (Signed)
Case Management Note  Patient Details  Name: Dawn Velasquez MRN: [id number] Date of Birth: May 03, 1986  Subjective/Objective:       Gi bleeding with hypotension             Action/Plan: home   Expected Discharge Date:  06/08/16               Expected Discharge Plan:  Home/Self Care  In-House Referral:     Discharge planning Services     Post Acute Care Choice:    Choice offered to:     DME Arranged:    DME Agency:     HH Arranged:    HH Agency:     Status of Service:  In process, will continue to follow  If discussed at Long Length of Stay Meetings, dates discussed:    Additional Comments:Date:  June 06, 2016 Chart reviewed for concurrent status and case management needs. Will continue to follow the patient for status change: Discharge Planning: following for needs Expected discharge date: [id number] Marcelle SmilingRhonda Davis, BSN, [place], [place]   [phone]  Golda Acreavis, Rhonda Lynn, RN 06/06/2016, 9:59 AM

## 2016-06-06 NOTE — ED Notes (Signed)
[facility] here for pt, (transport delayed) [facility] to [facility], states, "he has a TBI, and I can't leave him alone", pt calling her father. (Visitor alert, NAD, calm, interactive, steady on feet).

## 2016-06-06 NOTE — H&P (Signed)
[facility] History and Physical  Aldine Chakraborty ZOX:[id number] DOB: 24-Apr-1986 DOA: 06/06/2016   PCP: Pcp Not In System  Specialists: Followed by the HIV clinic in [facility]  Chief Complaint: Rectal bleeding  HPI: Dawn Velasquez is a 30 y.o. 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 This was followed by onset of abdominal pain. The pain would last a few minutes. Was 7 out of 10 in intensity. She tells me that she feels all of her symptoms are due to low platelet counts. She's had similar symptoms before and was last hospitalized at [facility] for same in March of this year. She was transfused platelets. She was given steroids. 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 She also underwent a colonoscopy which showed internal hemorrhoids. She was stabilized and then was discharged home with improvement in her counts. She states that usually she does have hemorrhoidal bleeding which stops on its own. However, this time it hasn't stopped and so she got concerned and decided to come in to the emergency department. She's had nausea but no vomiting. Denies any fever. Denies any blood in the urine. She's status post hysterectomy and denies any vaginal bleeding. She has noted bruising all over her body.  Home Medications: Prior to Admission medications   Medication Sig Start Date End Date Taking? Authorizing Provider  amphetamine-dextroamphetamine (ADDERALL) 20 MG tablet Take 20 mg by mouth daily.   Yes Historical Provider, MD  clonazePAM (KLONOPIN) 1 MG tablet Take 1 mg by mouth 3 (three) times daily as needed for anxiety.   Yes Historical Provider, MD    Darunavir Ethanolate (PREZISTA PO) Take 1 tablet by mouth daily.    Historical Provider, MD  emtricitabine-tenofovir (TRUVADA) 200-300 MG per tablet Take 1 tablet by mouth 2 (two) times daily. HIV medication.  Consult needed.    Historical Provider, MD  oxyCODONE-acetaminophen (PERCOCET/ROXICET) 5-325 MG tablet Take 1 tablet by mouth every 8 (eight) hours as needed for severe pain. Patient not taking: Reported on 06/06/2016 03/05/16   [facility], PA-C  ritonavir (NORVIR) 100 MG capsule Take 100 mg by mouth 2 (two) times daily. HIV medication.  Consult needed.    Historical Provider, MD    Allergies:  Allergies  Allergen Reactions  . Bee Venom Anaphylaxis  . Penicillins Anaphylaxis    Has patient had a PCN reaction causing immediate rash, facial/tongue/throat swelling, SOB or lightheadedness with hypotension: Yes Has patient had a PCN reaction causing severe rash involving mucus membranes or skin necrosis: Yes Has patient had a PCN reaction that required hospitalization No Has patient had a PCN reaction occurring within the last 10 years: No If all of the above answers are "NO", then may proceed with Cephalosporin use.  . Naproxen Nausea Only  . Tramadol Other (See Comments)    seizures    Past Medical History: Past Medical History:  Diagnosis Date  . Anxiety   . Hepatitis C   . HIV (human immunodeficiency virus infection) (HCC)   . Kidney stone   . Ovarian cyst   . Seizures (HCC)   . Simple seizure (HCC)    r/t taking tramadol  . Thrombocytopenia (HCC)   .  TMJ syndrome     Past Surgical History:  Procedure Laterality Date  . ABDOMINAL HYSTERECTOMY    . ANKLE SURGERY    . APPENDECTOMY    . CESAREAN SECTION    . FRACTURE SURGERY    . LITHOTRIPSY    . TUBAL LIGATION      Social History: She lives in [place] with her fianc. She smokes half a pack of cigarettes on a daily basis. No alcohol use. Denies any illicit drug use. Usually independent with daily  activities.  Family History: Denies any health problems in her family.  Review of Systems - History obtained from the patient General ROS: positive for  - fatigue Psychological ROS: positive for - anxiety Ophthalmic ROS: negative ENT ROS: negative Allergy and Immunology ROS: negative Hematological and Lymphatic ROS: as in hpi Endocrine ROS: negative Respiratory ROS: no cough, shortness of breath, or wheezing Cardiovascular ROS: no chest pain or dyspnea on exertion Gastrointestinal ROS: as in hpi Genito-Urinary ROS: no dysuria, trouble voiding, or hematuria Musculoskeletal ROS: as in hpi Neurological ROS: no TIA or stroke symptoms Dermatological ROS: as in hpi  Physical Examination  Vitals:   06/06/16 0600 06/06/16 0615 06/06/16 0715 06/06/16 0800  BP: 102/80 102/80 105/73 96/60  Pulse: (!) 59 60 (!) 57 (!) 54  Resp:   19 11  Temp:    98.2 F (36.8 C)  TempSrc:    Oral  SpO2: 99% 100% 100% 100%  Weight:      Height:        BP 96/60   Pulse (!) 54   Temp 98.2 F (36.8 C) (Oral)   Resp 11   Ht 5\' 5"  (1.651 m)   Wt 59 kg (130 lb)   LMP 06/25/2012   SpO2 100%   BMI 21.63 kg/m   General appearance: alert, cooperative, appears stated age and no distress Head: Normocephalic, without obvious abnormality, atraumatic Eyes: conjunctivae/corneas clear. PERRL, EOM's intact.  Throat: lips, mucosa, and tongue normal; teeth and gums normal Neck: no adenopathy, no carotid bruit, no JVD, supple, symmetrical, trachea midline and thyroid not enlarged, symmetric, no tenderness/mass/nodules Resp: clear to auscultation bilaterally Cardio: regular rate and rhythm, S1, S2 normal, no murmur, click, rub or gallop GI: Abdomen is soft. Mildly tender diffusely without any rebound, rigidity or guarding. No masses or organomegaly. Bowel present. Rectal exam: Only inspection was performed as the patient did have digital exam in the ED. She does have large external hemorrhoid. No active bleeding  is noted currently. Old blood is noted. Extremities: extremities normal, atraumatic, no cyanosis or edema Pulses: 2+ and symmetric Skin: Bruising noted all over her body. No petechiae Lymph nodes: Cervical, supraclavicular, and axillary nodes normal. Neurologic: Awake and alert. Oriented 3. No focal neurological deficits.   Labs on Admission: I have personally reviewed following labs and imaging studies  CBC:  Recent Labs Lab 06/06/16 0355  WBC 3.0*  NEUTROABS 1.8  HGB 14.0  HCT 41.3  MCV 86.9  PLT <30*   Basic Metabolic Panel:  Recent Labs Lab 06/06/16 0355  NA 139  K 4.0  CL 107  CO2 26  GLUCOSE 82  BUN 14  CREATININE 0.87  CALCIUM 9.4   GFR: Estimated Creatinine Clearance: 83.5 mL/min (by C-G formula based on SCr of 0.87 mg/dL). Liver Function Tests:  Recent Labs Lab 06/06/16 0355  AST 61*  ALT 80*  ALKPHOS 52  BILITOT 1.1  PROT 7.1  ALBUMIN 4.0   Coagulation Profile:  Recent Labs  Lab 06/06/16 0355  INR 0.91    Radiological Exams on Admission: No results found.    Problem List  Principal Problem:   Thrombocytopenia (HCC) Active Problems:   Rectal bleeding   HIV disease (HCC)   Assessment: This is a 30 year old Caucasian female with past medical history as stated earlier, who presents with rectal bleeding and is found to have low platelet counts.   Plan: #1 Rectal bleeding: Most likely hemorrhoidal. She was in [facility] back in March for similar complaints and underwent a colonoscopy there which showed internal hemorrhoids. Her hemoglobin is surprisingly normal. This will be monitored closely. Bleeding should subside at her as her platelet counts improve. No clear indication to consult gastroenterology at this time. She is hemodynamically stable.  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 Discussed with Dr. Truett PernaSherrill with hematology. He recommends Solu-Medrol and a platelet transfusion. Transfusion will  be ordered as she has bleeding which has not subsided for 2 days. She did have a mild urticarial reaction when she was transfused back in March. She will be given Benadryl and Tylenol prior to transfusion. Monitor platelet counts closely. He recommends that she follow-up with hematology at [facility] when ready for discharge. [facility], her counts were as low as 18,000 at the time of admission and improved to 90,000 at the time of discharge. She needs to be started back on her HIV medicine as soon as possible.  #3 Abdominal pain: Abdomen is benign to exam. She reports that she's had similar symptoms in the past whenever she's had rectal bleeding and low platelet counts. She did have an ultrasound when she was [facility], which showed normal spleen. She will undergo abdominal films. Continue to monitor clinically. Clear liquids for now. Check lipase  #3 history of HIV disease: Patient hasn't taken her HIV medications medications in the last 1-2 months. She was seen by the ID clinic back in August. She stated that she has run out of insurance, but is going to get assistance through one of the HIV programs. She should be started on treatment as soon as possible which will help resolve the thrombocytopenia issue as well. For now, she'll be stabilized as discussed above.  #4 history of anxiety: Continue Klonopin.  #5 Mildly elevated LFTs: She does report a history of hepatitis C. Monitor closely.  DVT Prophylaxis: SCDs Code Status: Full code Family Communication: Discussed with the patient  Disposition Plan: Admit to stepdown for now  Consults called: Discussed with hematology over the phone  Admission status: Inpatient status due to severity of illness including severe thrombocytopenia and recfal bleeding with increased risk for life-threatening bleeding.   Further management decisions will depend on results of further testing and patient's response to  treatment.   [facility] Rehabilitation HospitalKRISHNAN,Jasper Ruminski  [facility] Pager [phone]  If 7PM-7AM, please contact night-coverage [email or web address] Password [email or web address]  06/06/2016, 9:02 AM

## 2016-06-07 LAB — CBC
HEMATOCRIT: 35.9 % — AB (ref 36.0–46.0)
HEMATOCRIT: 37.4 % (ref 36.0–46.0)
HEMOGLOBIN: 12.6 g/dL (ref 12.0–15.0)
Hemoglobin: 12.2 g/dL (ref 12.0–15.0)
MCH: 29.3 pg (ref 26.0–34.0)
MCH: 29.2 pg (ref 26.0–34.0)
MCHC: 34 g/dL (ref 30.0–36.0)
MCHC: 33.7 g/dL (ref 30.0–36.0)
MCV: 86.1 fL (ref 78.0–100.0)
MCV: 86.8 fL (ref 78.0–100.0)
PLATELETS: 13 10*3/uL — AB (ref 150–400)
Platelets: 17 10*3/uL — CL (ref 150–400)
RBC: 4.17 MIL/uL (ref 3.87–5.11)
RBC: 4.31 MIL/uL (ref 3.87–5.11)
RDW: 12.6 % (ref 11.5–15.5)
RDW: 12.6 % (ref 11.5–15.5)
WBC: 4.8 10*3/uL (ref 4.0–10.5)
WBC: 4.6 10*3/uL (ref 4.0–10.5)

## 2016-06-07 LAB — PREPARE PLATELET PHERESIS: Unit division: 0

## 2016-06-07 LAB — COMPREHENSIVE METABOLIC PANEL
ALBUMIN: 3.6 g/dL (ref 3.5–5.0)
ALK PHOS: 44 U/L (ref 38–126)
ALT: 60 U/L — AB (ref 14–54)
AST: 38 U/L (ref 15–41)
Anion gap: 5 (ref 5–15)
BILIRUBIN TOTAL: 1.1 mg/dL (ref 0.3–1.2)
BUN: 12 mg/dL (ref 6–20)
CALCIUM: 8.9 mg/dL (ref 8.9–10.3)
CO2: 25 mmol/L (ref 22–32)
CREATININE: 0.91 mg/dL (ref 0.44–1.00)
Chloride: 108 mmol/L (ref 101–111)
GFR calc Af Amer: >60 mL/min (ref >60)
GLUCOSE: 109 mg/dL — AB (ref 65–99)
POTASSIUM: 4.5 mmol/L (ref 3.5–5.1)
Sodium: 138 mmol/L (ref 135–145)
TOTAL PROTEIN: 6.6 g/dL (ref 6.5–8.1)

## 2016-06-07 MED ORDER — PREDNISONE 20 MG PO TABS: 60.0000 mg | ORAL_TABLET | Freq: Every day | ORAL | 0 refills | Status: DC

## 2016-06-07 MED ORDER — PREDNISONE 20 MG PO TABS: 60.0000 mg | ORAL_TABLET | Freq: Every day | ORAL | 0 refills | Status: AC

## 2016-06-07 MED ORDER — DIPHENHYDRAMINE HCL 50 MG/ML IJ SOLN
12.5000 mg | Freq: Four times a day (QID) | INTRAMUSCULAR | Status: DC | PRN
  Administered 2016-06-07: 12.5 mg via INTRAVENOUS
  Filled 2016-06-07: qty 1

## 2016-06-07 MED ORDER — OXYCODONE-ACETAMINOPHEN 5-325 MG PO TABS: 1.0000 | ORAL_TABLET | Freq: Three times a day (TID) | ORAL | 0 refills | Status: AC | PRN

## 2016-06-07 MED ORDER — SODIUM CHLORIDE 0.9 % IV SOLN
Freq: Once | INTRAVENOUS | Status: AC
  Administered 2016-06-07: 10:00:00 via INTRAVENOUS

## 2016-06-07 NOTE — Progress Notes (Signed)
Paged on call provider tonight 3 times regarding low platelet count, has not received a call back. Informed charge nurse Cherryl and incoming RN.

## 2016-06-07 NOTE — Progress Notes (Signed)
Patient had few episodes of non-sustaining sinus bradycardia when patient was asleep, asymptomatic.

## 2016-06-07 NOTE — Discharge Summary (Signed)
Dawn PitchKrystal Velasquez is a 30 y.o. 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 She was found to have platelet count of 13,000 , and she received 1 unit of platelets, and her repeat platelet count is 17,000. She was started on IV steroids and recommended hematology consult. But patient refused further evaluation and wanted to be discharged. She was very adamant about leaving even after explaining the complications and dangers of low platelet count. She signed AMA papers and left, and she was given prescription for prednisone.   Dawn ModyVijaya Destry Bezdek, MD [phone]

## 2016-06-07 NOTE — Progress Notes (Signed)
Pt signed AMA.  MD notified and aware.  Pt verbalized understanding of importance in medical care.  All belongings taken home with pt.  PIV removed from right forearm, intact.    Dawn Velasquez Noone, RN

## 2016-06-08 LAB — PREPARE PLATELET PHERESIS: UNIT DIVISION: 0

## 2016-08-04 ENCOUNTER — Emergency Department (HOSPITAL_COMMUNITY): Payer: Self-pay

## 2016-08-04 ENCOUNTER — Encounter (HOSPITAL_COMMUNITY): Payer: Self-pay | Admitting: Emergency Medicine

## 2016-08-04 ENCOUNTER — Emergency Department (HOSPITAL_COMMUNITY)
Admission: EM | Admit: 2016-08-04 | Discharge: 2016-08-05 | Discharge status: Against Medical Advice | Payer: Self-pay | Attending: Emergency Medicine | Admitting: Emergency Medicine

## 2016-08-04 DIAGNOSIS — F17210 Nicotine dependence, cigarettes, uncomplicated: Secondary | ICD-10-CM | POA: Insufficient documentation

## 2016-08-04 DIAGNOSIS — S8012XA Contusion of left lower leg, initial encounter: Secondary | ICD-10-CM | POA: Insufficient documentation

## 2016-08-04 DIAGNOSIS — S301XXA Contusion of abdominal wall, initial encounter: Secondary | ICD-10-CM | POA: Insufficient documentation

## 2016-08-04 DIAGNOSIS — Y929 Unspecified place or not applicable: Secondary | ICD-10-CM | POA: Insufficient documentation

## 2016-08-04 DIAGNOSIS — S8011XA Contusion of right lower leg, initial encounter: Secondary | ICD-10-CM | POA: Insufficient documentation

## 2016-08-04 DIAGNOSIS — S300XXA Contusion of lower back and pelvis, initial encounter: Secondary | ICD-10-CM | POA: Insufficient documentation

## 2016-08-04 DIAGNOSIS — D696 Thrombocytopenia, unspecified: Secondary | ICD-10-CM | POA: Insufficient documentation

## 2016-08-04 DIAGNOSIS — Y999 Unspecified external cause status: Secondary | ICD-10-CM | POA: Insufficient documentation

## 2016-08-04 DIAGNOSIS — Y939 Activity, unspecified: Secondary | ICD-10-CM | POA: Insufficient documentation

## 2016-08-04 DIAGNOSIS — Z79899 Other long term (current) drug therapy: Secondary | ICD-10-CM | POA: Insufficient documentation

## 2016-08-04 DIAGNOSIS — S0083XA Contusion of other part of head, initial encounter: Secondary | ICD-10-CM | POA: Insufficient documentation

## 2016-08-04 LAB — COMPREHENSIVE METABOLIC PANEL
ALT: 43 U/L (ref 14–54)
ANION GAP: 6 (ref 5–15)
AST: 37 U/L (ref 15–41)
Albumin: 4 g/dL (ref 3.5–5.0)
Alkaline Phosphatase: 47 U/L (ref 38–126)
BILIRUBIN TOTAL: 2.3 mg/dL — AB (ref 0.3–1.2)
BUN: 15 mg/dL (ref 6–20)
CO2: 24 mmol/L (ref 22–32)
Calcium: 8.8 mg/dL — ABNORMAL LOW (ref 8.9–10.3)
Chloride: 107 mmol/L (ref 101–111)
Creatinine, Ser: 1.14 mg/dL — ABNORMAL HIGH (ref 0.44–1.00)
GFR calc Af Amer: >60 mL/min (ref >60)
Glucose, Bld: 90 mg/dL (ref 65–99)
POTASSIUM: 3.9 mmol/L (ref 3.5–5.1)
Sodium: 137 mmol/L (ref 135–145)
TOTAL PROTEIN: 7.1 g/dL (ref 6.5–8.1)

## 2016-08-04 LAB — PROTIME-INR
INR: 1.08
PROTHROMBIN TIME: 14.1 s (ref 11.4–15.2)

## 2016-08-04 LAB — PREGNANCY, URINE: PREG TEST UR: NEGATIVE

## 2016-08-04 LAB — TYPE AND SCREEN
ABO/RH(D): O POS
Antibody Screen: NEGATIVE

## 2016-08-04 LAB — LACTATE DEHYDROGENASE: LDH: 211 U/L — AB (ref 98–192)

## 2016-08-04 LAB — CBC
HEMATOCRIT: 33.5 % — AB (ref 36.0–46.0)
HEMOGLOBIN: 11.8 g/dL — AB (ref 12.0–15.0)
MCH: 29.8 pg (ref 26.0–34.0)
MCHC: 35.2 g/dL (ref 30.0–36.0)
MCV: 84.6 fL (ref 78.0–100.0)
Platelets: 14 10*3/uL — CL (ref 150–400)
RBC: 3.96 MIL/uL (ref 3.87–5.11)
RDW: 12.6 % (ref 11.5–15.5)
WBC: 3.5 10*3/uL — AB (ref 4.0–10.5)

## 2016-08-04 LAB — RAPID URINE DRUG SCREEN, HOSP PERFORMED
Amphetamines: NOT DETECTED
BENZODIAZEPINES: POSITIVE — AB
Barbiturates: NOT DETECTED
Cocaine: NOT DETECTED
Opiates: POSITIVE — AB
Tetrahydrocannabinol: NOT DETECTED

## 2016-08-04 LAB — URINALYSIS, ROUTINE W REFLEX MICROSCOPIC
Bilirubin Urine: NEGATIVE
Glucose, UA: NEGATIVE mg/dL
Hgb urine dipstick: NEGATIVE
KETONES UR: 15 mg/dL — AB
LEUKOCYTES UA: NEGATIVE
NITRITE: NEGATIVE
PH: 6.5 (ref 5.0–8.0)
Protein, ur: NEGATIVE mg/dL
SPECIFIC GRAVITY, URINE: 1.027 (ref 1.005–1.030)

## 2016-08-04 LAB — LIPASE, BLOOD: Lipase: 13 U/L (ref 11–51)

## 2016-08-04 MED ORDER — CLONAZEPAM 0.5 MG PO TABS
1.0000 mg | ORAL_TABLET | Freq: Once | ORAL | Status: AC
  Administered 2016-08-04: 1 mg via ORAL
  Filled 2016-08-04: qty 2

## 2016-08-04 MED ORDER — SODIUM CHLORIDE 0.9 % IV BOLUS (SEPSIS)
500.0000 mL | Freq: Once | INTRAVENOUS | Status: AC
  Administered 2016-08-04: 500 mL via INTRAVENOUS

## 2016-08-04 MED ORDER — OXYCODONE-ACETAMINOPHEN 5-325 MG PO TABS
1.0000 | ORAL_TABLET | Freq: Once | ORAL | Status: AC
  Administered 2016-08-04: 1 via ORAL
  Filled 2016-08-04: qty 1
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
## 2016-08-05 MED ORDER — DEXAMETHASONE SODIUM PHOSPHATE 10 MG/ML IJ SOLN
40.0000 mg | Freq: Once | INTRAMUSCULAR | Status: DC
Start: 2016-08-05 — End: 2016-08-05
  Filled 2016-08-05: qty 4

## 2016-08-05 MED ORDER — FAMOTIDINE 20 MG PO TABS: 20.0000 mg | ORAL_TABLET | Freq: Two times a day (BID) | ORAL | 0 refills | Status: AC

## 2016-08-05 MED ORDER — DEXAMETHASONE 4 MG PO TABS: 40.0000 mg | ORAL_TABLET | Freq: Every day | ORAL | 0 refills | Status: AC

## 2016-08-05 MED ORDER — DEXAMETHASONE 4 MG PO TABS: 40.0000 mg | ORAL_TABLET | Freq: Four times a day (QID) | ORAL | Status: DC

## 2016-08-05 NOTE — ED Notes (Signed)
Pt walked out before receiving prescriptions

## 2016-08-05 NOTE — ED Notes (Signed)
Pt refused to sign AMA paperwork

## 2016-08-05 NOTE — ED Notes (Addendum)
Pt states that they want to leave because the provider will not provide pain medicine with the decadron. Pt is cursing and yelling about not getting this medication and says the the Doctors are "idiots".

## 2017-02-22 ENCOUNTER — Emergency Department (HOSPITAL_BASED_OUTPATIENT_CLINIC_OR_DEPARTMENT_OTHER): Payer: Self-pay

## 2017-02-22 ENCOUNTER — Emergency Department (HOSPITAL_BASED_OUTPATIENT_CLINIC_OR_DEPARTMENT_OTHER)
Admission: EM | Admit: 2017-02-22 | Discharge: 2017-02-22 | Disposition: A | Discharge status: Home / Self Care | Payer: Self-pay | Attending: Internal Medicine | Admitting: Internal Medicine

## 2017-02-22 ENCOUNTER — Encounter (HOSPITAL_BASED_OUTPATIENT_CLINIC_OR_DEPARTMENT_OTHER): Payer: Self-pay | Admitting: Emergency Medicine

## 2017-02-22 DIAGNOSIS — Z79899 Other long term (current) drug therapy: Secondary | ICD-10-CM | POA: Insufficient documentation

## 2017-02-22 DIAGNOSIS — M25571 Pain in right ankle and joints of right foot: Secondary | ICD-10-CM | POA: Insufficient documentation

## 2017-02-22 DIAGNOSIS — M79644 Pain in right finger(s): Secondary | ICD-10-CM | POA: Insufficient documentation

## 2017-02-22 DIAGNOSIS — F17210 Nicotine dependence, cigarettes, uncomplicated: Secondary | ICD-10-CM | POA: Insufficient documentation

## 2017-02-22 HISTORY — DX: Malingerer (conscious simulation): Z76.5

## 2017-02-22 HISTORY — DX: Other psychoactive substance abuse, uncomplicated: F19.10

## 2017-02-22 NOTE — ED Notes (Signed)
Assumed care of patient from [place], [place]. Pt resting quietly, awaiting disposition. EDP to bedside.

## 2017-02-22 NOTE — ED Notes (Signed)
Pt supposed to be using ASO and crutches from [facility] visit, pt came in ambulatory without assistive devices

## 2017-02-22 NOTE — ED Notes (Signed)
Pt received Rx for 12 vicodin [facility].

## 2017-02-22 NOTE — Discharge Instructions (Signed)
Rest, ice, elevate the ankle. Tylenol for pain. X-ray of thumb reveals no fractures. Please follow-up with orthopedic doctor.
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
## 2017-11-03 ENCOUNTER — Emergency Department (INDEPENDENT_AMBULATORY_CARE_PROVIDER_SITE_OTHER): Payer: Self-pay

## 2017-11-03 ENCOUNTER — Other Ambulatory Visit: Payer: Self-pay

## 2017-11-03 ENCOUNTER — Emergency Department (INDEPENDENT_AMBULATORY_CARE_PROVIDER_SITE_OTHER)
Admission: EM | Admit: 2017-11-03 | Discharge: 2017-11-03 | Disposition: A | Discharge status: Home / Self Care | Payer: Self-pay | Source: Home / Self Care | Attending: Family Medicine | Admitting: Family Medicine

## 2017-11-03 DIAGNOSIS — J069 Acute upper respiratory infection, unspecified: Secondary | ICD-10-CM

## 2017-11-03 DIAGNOSIS — R05 Cough: Secondary | ICD-10-CM

## 2017-11-03 DIAGNOSIS — B9789 Other viral agents as the cause of diseases classified elsewhere: Secondary | ICD-10-CM

## 2017-11-03 DIAGNOSIS — R0989 Other specified symptoms and signs involving the circulatory and respiratory systems: Secondary | ICD-10-CM

## 2017-11-03 MED ORDER — BENZONATATE 100 MG PO CAPS: 100.0000 mg | ORAL_CAPSULE | Freq: Three times a day (TID) | ORAL | 0 refills | Status: AC

## 2017-11-03 MED ORDER — BENZONATATE 100 MG PO CAPS: 100.0000 mg | ORAL_CAPSULE | Freq: Three times a day (TID) | ORAL | 0 refills | Status: DC

## 2017-11-03 NOTE — ED Provider Notes (Signed)
[facility]    CSN: [id number] Arrival date & time: 11/03/17  1622     History   Chief Complaint Chief Complaint  Patient presents with  . Cough  . Sore Throat  . Headache    HPI Dawn Velasquez is a 32 y.o. female.   HPI  Dawn Velasquez is a 32 y.o. female presenting to UC with c/o 4 days of dry hacking cough that has lead to post-tussive vomiting twice with clear mucous, associated generalized HA, throat pain and chest pain from coughing.  She has tried OTC cough medications including robitussin and mucinex w/o relief.  Her boss has been sick with similar symptoms. No known exposure to flu. Pt denies hx of asthma. She does have HIV but states her viral load is "undetectable."  Denies fever or chills.    Past Medical History:  Diagnosis Date  . Anxiety   . Drug-seeking behavior   . Hepatitis C   . HIV (human immunodeficiency virus infection) (HCC)   . Kidney stone   . Ovarian cyst   . Seizures (HCC)   . Simple seizure (HCC)    r/t taking tramadol  . Substance abuse (HCC)   . Thrombocytopenia (HCC)   . TMJ syndrome     Patient Active Problem List   Diagnosis Date Noted  . Thrombocytopenia (HCC) 06/06/2016  . Rectal bleeding 06/06/2016  . HIV disease (HCC) 06/06/2016    Past Surgical History:  Procedure Laterality Date  . ABDOMINAL HYSTERECTOMY    . ANKLE SURGERY    . APPENDECTOMY    . CESAREAN SECTION    . FRACTURE SURGERY    . LITHOTRIPSY    . TUBAL LIGATION      OB History    No data available       Home Medications    Prior to Admission medications   Medication Sig Start Date End Date Taking? Authorizing Provider  benzonatate (TESSALON) 100 MG capsule Take 1-2 capsules (100-200 mg total) by mouth every 8 (eight) hours. 11/03/17   Lurene Shadow, PA-C  clonazePAM (KLONOPIN) 1 MG tablet Take 1 mg by mouth 3 (three) times daily as needed for anxiety.    [provider]  darunavir-cobicistat (PREZCOBIX)  800-150 MG tablet Take 1 tablet by mouth daily. 08/01/16   [provider]  dexamethasone (DECADRON) 4 MG tablet Take 10 tablets (40 mg total) by mouth daily. 08/05/16   Tilden Fossa, MD  emtricitabine-tenofovir (TRUVADA) 200-300 MG per tablet Take 1 tablet by mouth 2 (two) times daily. HIV medication.  Consult needed.    [provider]  famotidine (PEPCID) 20 MG tablet Take 1 tablet (20 mg total) by mouth 2 (two) times daily. 08/05/16   Tilden Fossa, MD  oxyCODONE-acetaminophen (PERCOCET/ROXICET) 5-325 MG tablet Take 1 tablet by mouth every 8 (eight) hours as needed for severe pain. Patient not taking: Reported on 08/04/2016 06/07/16   Kathlen Mody, MD  predniSONE (DELTASONE) 20 MG tablet Take 3 tablets (60 mg total) by mouth daily with breakfast. Patient not taking: Reported on 08/04/2016 06/07/16   Kathlen Mody, MD    Family History History reviewed. No pertinent family history.  Social History Social History   Tobacco Use  . Smoking status: Current Every Day Smoker    Packs/day: 0.50    Types: Cigarettes  . Smokeless tobacco: Never Used  Substance Use Topics  . Alcohol use: No  . Drug use: No     Allergies   Bee venom; Penicillins;  Naproxen; Nsaids; and Tramadol   Review of Systems Review of Systems  Constitutional: Negative for chills and fever.  HENT: Positive for congestion. Negative for ear pain, sore throat, trouble swallowing and voice change.   Respiratory: Positive for cough. Negative for shortness of breath.   Cardiovascular: Positive for chest pain ("from coughing so much"). Negative for palpitations.  Gastrointestinal: Positive for vomiting ( post-tussive). Negative for abdominal pain, diarrhea and nausea.  Musculoskeletal: Negative for arthralgias, back pain and myalgias.  Skin: Negative for rash.     Physical Exam Triage Vital Signs ED Triage Vitals  Enc Vitals Group     BP 11/03/17 1709 101/83     Pulse Rate 11/03/17 1709 93       Resp --      Temp 11/03/17 1709 98.1 F (36.7 C)     Temp Source 11/03/17 1709 Oral     SpO2 11/03/17 1709 99 %     Weight 11/03/17 1710 131 lb (59.4 kg)     Height 11/03/17 1710 5\' 5"  (1.651 m)     Head Circumference --      Peak Flow --      Pain Score 11/03/17 1709 4     Pain Loc --      Pain Edu? --      Excl. in GC? --    No data found.  Updated Vital Signs BP 101/83 (BP Location: Right Arm)   Pulse 93   Temp 98.1 F (36.7 C) (Oral)   Ht 5\' 5"  (1.651 m)   Wt 131 lb (59.4 kg)   LMP 06/25/2012   SpO2 99%   BMI 21.80 kg/m   Visual Acuity Right Eye Distance:   Left Eye Distance:   Bilateral Distance:    Right Eye Near:   Left Eye Near:    Bilateral Near:     Physical Exam  Constitutional: She is oriented to person, place, and time. She appears well-developed and well-nourished.  Non-toxic appearance. She does not appear ill. No distress.  HENT:  Head: Normocephalic and atraumatic.  Right Ear: Tympanic membrane normal.  Left Ear: Tympanic membrane normal.  Nose: Nose normal.  Mouth/Throat: Uvula is midline, oropharynx is clear and moist and mucous membranes are normal.  Eyes: EOM are normal.  Neck: Normal range of motion. Neck supple.  Cardiovascular: Normal rate and regular rhythm.  Pulmonary/Chest: Effort normal and breath sounds normal. No stridor. No respiratory distress. She has no wheezes. She has no rhonchi. She has no rales.  Intermittent dry cough during exam. Evidence of respiratory distress  Musculoskeletal: Normal range of motion.  Neurological: She is alert and oriented to person, place, and time.  Skin: Skin is warm and dry.  Psychiatric: She has a normal mood and affect. Her behavior is normal.  Nursing note and vitals reviewed.    UC Treatments / Results  Labs (all labs ordered are listed, but only abnormal results are displayed) Labs Reviewed - No data to display  EKG  EKG Interpretation None       Radiology Dg Chest 2  View  Result Date: 11/03/2017 CLINICAL DATA:  Cough and congestion for the past 4 days. EXAM: CHEST  2 VIEW COMPARISON:  Chest x-ray dated August 04, 2016. FINDINGS: The heart size and mediastinal contours are within normal limits. Both lungs are clear. The visualized skeletal structures are unremarkable. IMPRESSION: No active cardiopulmonary disease. Electronically Signed   By: Obie Dredge M.D.   On: 11/03/2017 17:36  Procedures Procedures (including critical care time)  Medications Ordered in UC Medications - No data to display   Initial Impression / Assessment and Plan / UC Course  I have reviewed the triage vital signs and the nursing notes.  Pertinent labs & imaging results that were available during my care of the patient were reviewed by me and considered in my medical decision making (see chart for details).     Hx and exam c/w viral URI Reassured pt of normal CXR and normal vitals Encouraged fluids, rest, acetaminophen and ibuprofen May try prescribed tessalon F/u with PCP in 1 week if not improving.   Final Clinical Impressions(s) / UC Diagnoses   Final diagnoses:  Viral URI with cough    ED Discharge Orders        Ordered    benzonatate (TESSALON) 100 MG capsule  Every 8 hours,   Status:  Discontinued     11/03/17 1753    benzonatate (TESSALON) 100 MG capsule  Every 8 hours     11/03/17 1810       Controlled Substance Prescriptions [place] Controlled Substance Registry consulted? Not Applicable   Rolla Platehelps, Marene Gilliam O, PA-C 11/03/17 [id number]

## 2017-11-03 NOTE — Discharge Instructions (Signed)
°  You may take 500mg acetaminophen every 4-6 hours or in combination with ibuprofen 400-600mg every 6-8 hours as needed for pain, inflammation, and fever. ° °Be sure to drink at least eight 8oz glasses of water to stay well hydrated and get at least 8 hours of sleep at night, preferably more while sick.  ° °

## 2017-11-03 NOTE — ED Triage Notes (Signed)
Started 4 days ago with with a headache, then turned to a cough.  Has vomited from coughing, states that her throat and chest hurts from coughing, and she can not sleep.
